# Patient Record
Sex: Female | Born: 1990 | Race: White | Hispanic: No | Marital: Single | Smoking: Never smoker
Health system: Southern US, Community
[De-identification: ages and names within clinical notes are randomized; demographics above are authoritative.]

## PROBLEM LIST (undated history)

## (undated) VITALS — BP 99/66 | HR 86 | Temp 98.1°F | Resp 18 | Ht 64.0 in | Wt 169.0 lb

## (undated) DIAGNOSIS — F329 Major depressive disorder, single episode, unspecified: Secondary | ICD-10-CM

## (undated) DIAGNOSIS — I341 Nonrheumatic mitral (valve) prolapse: Secondary | ICD-10-CM

## (undated) DIAGNOSIS — F609 Personality disorder, unspecified: Secondary | ICD-10-CM

## (undated) DIAGNOSIS — G43909 Migraine, unspecified, not intractable, without status migrainosus: Secondary | ICD-10-CM

## (undated) DIAGNOSIS — F419 Anxiety disorder, unspecified: Secondary | ICD-10-CM

## (undated) DIAGNOSIS — F32A Depression, unspecified: Secondary | ICD-10-CM

## (undated) DIAGNOSIS — F4310 Post-traumatic stress disorder, unspecified: Secondary | ICD-10-CM

## (undated) HISTORY — DX: Personality disorder, unspecified: F60.9

## (undated) HISTORY — DX: Major depressive disorder, single episode, unspecified: F32.9

## (undated) HISTORY — DX: Post-traumatic stress disorder, unspecified: F43.10

## (undated) HISTORY — DX: Depression, unspecified: F32.A

## (undated) HISTORY — PX: WISDOM TOOTH EXTRACTION: SHX21

---
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
## 2010-03-26 ENCOUNTER — Ambulatory Visit: Payer: Self-pay | Admitting: Gynecology

## 2010-09-05 ENCOUNTER — Emergency Department (HOSPITAL_COMMUNITY)
Admission: EM | Admit: 2010-09-05 | Discharge: 2010-09-05 | Disposition: A | Discharge status: Home / Self Care | Payer: PRIVATE HEALTH INSURANCE | Attending: Emergency Medicine | Admitting: Emergency Medicine

## 2010-09-05 DIAGNOSIS — T63481A Toxic effect of venom of other arthropod, accidental (unintentional), initial encounter: Secondary | ICD-10-CM | POA: Insufficient documentation

## 2010-09-05 DIAGNOSIS — T6391XA Toxic effect of contact with unspecified venomous animal, accidental (unintentional), initial encounter: Secondary | ICD-10-CM | POA: Insufficient documentation

## 2010-09-05 DIAGNOSIS — H81399 Other peripheral vertigo, unspecified ear: Secondary | ICD-10-CM | POA: Insufficient documentation

## 2010-09-05 LAB — GLUCOSE, CAPILLARY: Glucose-Capillary: 89 mg/dL (ref 70–99)

## 2010-11-12 ENCOUNTER — Emergency Department (HOSPITAL_COMMUNITY)
Admission: EM | Admit: 2010-11-12 | Discharge: 2010-11-12 | Disposition: A | Discharge status: Home / Self Care | Payer: 59 | Attending: Emergency Medicine | Admitting: Emergency Medicine

## 2010-11-12 DIAGNOSIS — R10819 Abdominal tenderness, unspecified site: Secondary | ICD-10-CM | POA: Insufficient documentation

## 2010-11-12 DIAGNOSIS — M545 Low back pain, unspecified: Secondary | ICD-10-CM | POA: Insufficient documentation

## 2010-11-12 DIAGNOSIS — R300 Dysuria: Secondary | ICD-10-CM | POA: Insufficient documentation

## 2010-11-12 DIAGNOSIS — R82998 Other abnormal findings in urine: Secondary | ICD-10-CM | POA: Insufficient documentation

## 2010-11-12 DIAGNOSIS — R319 Hematuria, unspecified: Secondary | ICD-10-CM | POA: Insufficient documentation

## 2010-11-12 DIAGNOSIS — R109 Unspecified abdominal pain: Secondary | ICD-10-CM | POA: Insufficient documentation

## 2010-11-12 DIAGNOSIS — N390 Urinary tract infection, site not specified: Secondary | ICD-10-CM | POA: Insufficient documentation

## 2010-11-12 LAB — URINE MICROSCOPIC-ADD ON

## 2010-11-12 LAB — URINALYSIS, ROUTINE W REFLEX MICROSCOPIC
Bilirubin Urine: NEGATIVE
Ketones, ur: NEGATIVE mg/dL
Nitrite: NEGATIVE
Protein, ur: 100 mg/dL — AB
Urobilinogen, UA: 1 mg/dL (ref 0.0–1.0)

## 2011-01-24 ENCOUNTER — Ambulatory Visit (INDEPENDENT_AMBULATORY_CARE_PROVIDER_SITE_OTHER): Payer: PRIVATE HEALTH INSURANCE

## 2011-01-24 DIAGNOSIS — G43009 Migraine without aura, not intractable, without status migrainosus: Secondary | ICD-10-CM

## 2011-01-24 DIAGNOSIS — F909 Attention-deficit hyperactivity disorder, unspecified type: Secondary | ICD-10-CM

## 2011-01-24 DIAGNOSIS — M545 Low back pain: Secondary | ICD-10-CM

## 2015-08-21 ENCOUNTER — Encounter (HOSPITAL_COMMUNITY): Payer: Self-pay | Admitting: *Deleted

## 2015-08-21 ENCOUNTER — Emergency Department (HOSPITAL_COMMUNITY): Payer: 59

## 2015-08-21 ENCOUNTER — Emergency Department (HOSPITAL_COMMUNITY)
Admission: EM | Admit: 2015-08-21 | Discharge: 2015-08-21 | Disposition: A | Discharge status: Home / Self Care | Payer: 59 | Attending: Emergency Medicine | Admitting: Emergency Medicine

## 2015-08-21 DIAGNOSIS — R102 Pelvic and perineal pain: Secondary | ICD-10-CM

## 2015-08-21 DIAGNOSIS — Z79899 Other long term (current) drug therapy: Secondary | ICD-10-CM | POA: Insufficient documentation

## 2015-08-21 DIAGNOSIS — Z791 Long term (current) use of non-steroidal anti-inflammatories (NSAID): Secondary | ICD-10-CM | POA: Insufficient documentation

## 2015-08-21 DIAGNOSIS — N730 Acute parametritis and pelvic cellulitis: Secondary | ICD-10-CM

## 2015-08-21 DIAGNOSIS — Z79818 Long term (current) use of other agents affecting estrogen receptors and estrogen levels: Secondary | ICD-10-CM | POA: Insufficient documentation

## 2015-08-21 HISTORY — DX: Nonrheumatic mitral (valve) prolapse: I34.1

## 2015-08-21 HISTORY — DX: Migraine, unspecified, not intractable, without status migrainosus: G43.909

## 2015-08-21 LAB — URINALYSIS, ROUTINE W REFLEX MICROSCOPIC
Bilirubin Urine: NEGATIVE
Glucose, UA: NEGATIVE mg/dL
Hgb urine dipstick: NEGATIVE
Ketones, ur: 15 mg/dL — AB
Leukocytes, UA: NEGATIVE
Nitrite: NEGATIVE
Protein, ur: NEGATIVE mg/dL
Specific Gravity, Urine: 1.01 (ref 1.005–1.030)
pH: 5.5 (ref 5.0–8.0)

## 2015-08-21 LAB — COMPREHENSIVE METABOLIC PANEL
ALT: 18 U/L (ref 14–54)
AST: 19 U/L (ref 15–41)
Albumin: 4.2 g/dL (ref 3.5–5.0)
Alkaline Phosphatase: 55 U/L (ref 38–126)
Anion gap: 7 (ref 5–15)
BUN: 11 mg/dL (ref 6–20)
CO2: 25 mmol/L (ref 22–32)
Calcium: 8.6 mg/dL — ABNORMAL LOW (ref 8.9–10.3)
Chloride: 105 mmol/L (ref 101–111)
Creatinine, Ser: 0.73 mg/dL (ref 0.44–1.00)
GFR calc Af Amer: >60 mL/min (ref >60)
GFR calc non Af Amer: >60 mL/min (ref >60)
Glucose, Bld: 97 mg/dL (ref 65–99)
Potassium: 3.2 mmol/L — ABNORMAL LOW (ref 3.5–5.1)
Sodium: 137 mmol/L (ref 135–145)
Total Bilirubin: 0.4 mg/dL (ref 0.3–1.2)
Total Protein: 7.1 g/dL (ref 6.5–8.1)

## 2015-08-21 LAB — CBC WITH DIFFERENTIAL/PLATELET
Basophils Absolute: 0 10*3/uL (ref 0.0–0.1)
Basophils Relative: 0 %
Eosinophils Absolute: 0.3 10*3/uL (ref 0.0–0.7)
Eosinophils Relative: 3 %
HCT: 39.8 % (ref 36.0–46.0)
Hemoglobin: 14.7 g/dL (ref 12.0–15.0)
Lymphocytes Relative: 31 %
Lymphs Abs: 2.9 10*3/uL (ref 0.7–4.0)
MCH: 30.6 pg (ref 26.0–34.0)
MCHC: 36.9 g/dL — ABNORMAL HIGH (ref 30.0–36.0)
MCV: 82.9 fL (ref 78.0–100.0)
Monocytes Absolute: 0.8 10*3/uL (ref 0.1–1.0)
Monocytes Relative: 8 %
Neutro Abs: 5.3 10*3/uL (ref 1.7–7.7)
Neutrophils Relative %: 58 %
Platelets: 295 10*3/uL (ref 150–400)
RBC: 4.8 MIL/uL (ref 3.87–5.11)
RDW: 12.4 % (ref 11.5–15.5)
WBC: 9.2 10*3/uL (ref 4.0–10.5)

## 2015-08-21 LAB — WET PREP, GENITAL
Sperm: NONE SEEN
Trich, Wet Prep: NONE SEEN
Yeast Wet Prep HPF POC: NONE SEEN

## 2015-08-21 LAB — PREGNANCY, URINE: Preg Test, Ur: NEGATIVE

## 2015-08-21 MED ORDER — DOXYCYCLINE HYCLATE 100 MG PO TABS
100.0000 mg | ORAL_TABLET | Freq: Once | ORAL | Status: AC
  Administered 2015-08-21: 100 mg via ORAL
  Filled 2015-08-21: qty 1

## 2015-08-21 MED ORDER — METRONIDAZOLE 500 MG PO TABS
500.0000 mg | ORAL_TABLET | Freq: Once | ORAL | Status: AC
  Administered 2015-08-21: 500 mg via ORAL
  Filled 2015-08-21: qty 1

## 2015-08-21 MED ORDER — METRONIDAZOLE 500 MG PO TABS: 500.0000 mg | ORAL_TABLET | Freq: Two times a day (BID) | ORAL | Status: DC

## 2015-08-21 MED ORDER — DOXYCYCLINE HYCLATE 100 MG PO CAPS: 100.0000 mg | ORAL_CAPSULE | Freq: Two times a day (BID) | ORAL | Status: DC

## 2015-08-21 MED ORDER — TRAMADOL HCL 50 MG PO TABS: 50.0000 mg | ORAL_TABLET | Freq: Four times a day (QID) | ORAL | Status: DC | PRN

## 2015-08-21 MED ORDER — MORPHINE SULFATE (PF) 4 MG/ML IV SOLN
4.0000 mg | Freq: Once | INTRAVENOUS | Status: AC
  Administered 2015-08-21: 4 mg via INTRAVENOUS
  Filled 2015-08-21: qty 1

## 2015-08-21 MED ORDER — LIDOCAINE HCL 1 % IJ SOLN
INTRAMUSCULAR | Status: AC
  Administered 2015-08-21: 0.9 mL
  Filled 2015-08-21: qty 20

## 2015-08-21 MED ORDER — CEFTRIAXONE SODIUM 250 MG IJ SOLR
250.0000 mg | Freq: Once | INTRAMUSCULAR | Status: AC
  Administered 2015-08-21: 250 mg via INTRAMUSCULAR
  Filled 2015-08-21: qty 250

## 2015-08-21 NOTE — ED Notes (Signed)
Patient transported to Ultrasound 

## 2015-08-21 NOTE — ED Notes (Signed)
Pt states that she began sudden intense sharp abdominal / vaginal pain; pt states that she is now having "the worst cramps of my life"; pt states "I think my IUD has slipped, I cannot find the strings"; pt denies vaginal bleeding or discharge; pt unable to sit due to pain

## 2015-08-21 NOTE — Discharge Instructions (Signed)
Please take medication as directed, please follow-up with primary care for reevaluation in 3 days. If new or worsening signs or symptoms present please return to the emergency room for reevaluation.

## 2015-08-21 NOTE — ED Provider Notes (Signed)
CSN: [id number]     Arrival date & time 08/21/15  8295 History   First MD Initiated Contact with Patient 08/21/15 [phone]     Chief Complaint  Patient presents with  . Vaginal Pain    HPI   25 year old female presents today with vaginal pain. Patient reports that approximately one hour prior to arrival she was masturbating when she felt sharp shooting pain in her vagina. Patient reports symptoms have persisted, she denies any radiation of symptoms, denies any nausea or vomiting, upper abdominal pain, fever, chills, vaginal discharge or vaginal bleeding. Patient reports that she has an IUD, is no longer able to see the strings of the IUD.    Past Medical History  Diagnosis Date  . Mitral valve prolapse   . Migraine    Past Surgical History  Procedure Laterality Date  . Wisdom tooth extraction     No family history on file. Social History  Substance Use Topics  . Smoking status: Never Smoker   . Smokeless tobacco: None  . Alcohol Use: Yes     Comment: socially   OB History    No data available     Review of Systems  All other systems reviewed and are negative.  Allergies  Iodine  Home Medications   Prior to Admission medications   Medication Sig Start Date End Date Taking? Authorizing Provider  acetaminophen (TYLENOL) 500 MG tablet Take 500-1,000 mg by mouth every 6 (six) hours as needed for mild pain or moderate pain.   Yes Historical Provider, MD  fexofenadine (ALLEGRA ALLERGY) 180 MG tablet Take 180 mg by mouth daily.   Yes Historical Provider, MD  Levonorgestrel (MIRENA, 52 MG, IU) 1 Device by Intrauterine route once.   Yes Historical Provider, MD  naproxen sodium (ANAPROX) 220 MG tablet Take 440 mg by mouth 2 (two) times daily as needed (pain).   Yes Historical Provider, MD  doxycycline (VIBRAMYCIN) 100 MG capsule Take 1 capsule (100 mg total) by mouth 2 (two) times daily. 08/21/15   Eyvonne Mechanic, PA-C  metroNIDAZOLE (FLAGYL) 500 MG tablet Take 1 tablet (500 mg  total) by mouth 2 (two) times daily. 08/21/15   Eyvonne Mechanic, PA-C  traMADol (ULTRAM) 50 MG tablet Take 1 tablet (50 mg total) by mouth every 6 (six) hours as needed. 08/21/15   Eyvonne Mechanic, PA-C   BP 116/72 mmHg  Pulse 74  Temp(Src) 98.4 F (36.9 C) (Oral)  Resp 14  Ht  (1.626 m)  Wt 77.111 kg  BMI 29.17 kg/m2  SpO2 97%   Physical Exam  Constitutional: She is oriented to person, place, and time. She appears well-developed and well-nourished.  HENT:  Head: Normocephalic and atraumatic.  Eyes: Conjunctivae are normal. Pupils are equal, round, and reactive to light. Right eye exhibits no discharge. Left eye exhibits no discharge. No scleral icterus.  Neck: Normal range of motion. No JVD present. No tracheal deviation present.  Pulmonary/Chest: Effort normal. No stridor.  Genitourinary: Uterus normal. Cervix exhibits motion tenderness and discharge. Cervix exhibits no friability. Right adnexum displays no mass, no tenderness and no fullness. Left adnexum displays no mass, no tenderness and no fullness.  Sticky white discharge no purulence noted   Neurological: She is alert and oriented to person, place, and time. Coordination normal.  Psychiatric: She has a normal mood and affect. Her behavior is normal. Judgment and thought content normal.  Nursing note and vitals reviewed.   ED Course  Procedures (including critical care time) Labs Review  Labs Reviewed  WET PREP, GENITAL - Abnormal; Notable for the following:    Clue Cells Wet Prep HPF POC PRESENT (*)    WBC, Wet Prep HPF POC FEW (*)    All other components within normal limits  CBC WITH DIFFERENTIAL/PLATELET - Abnormal; Notable for the following:    MCHC 36.9 (*)    All other components within normal limits  COMPREHENSIVE METABOLIC PANEL - Abnormal; Notable for the following:    Potassium 3.2 (*)    Calcium 8.6 (*)    All other components within normal limits  URINALYSIS, ROUTINE W REFLEX MICROSCOPIC (NOT AT [facility]) -  Abnormal; Notable for the following:    Ketones, ur 15 (*)    All other components within normal limits  PREGNANCY, URINE  GC/CHLAMYDIA PROBE AMP ([place]) NOT AT [facility]    Imaging Review [place] Transvaginal Non-ob  08/21/2015  CLINICAL DATA:  Acute onset of midline pelvic pain question ovarian torsion ; has IUD; no menses for 1 year EXAM: TRANSABDOMINAL AND TRANSVAGINAL ULTRASOUND OF PELVIS DOPPLER ULTRASOUND OF OVARIES TECHNIQUE: Both transabdominal and transvaginal ultrasound examinations of the pelvis were performed. Transabdominal technique was performed for global imaging of the pelvis including uterus, ovaries, adnexal regions, and pelvic cul-de-sac. It was necessary to proceed with endovaginal exam following the transabdominal exam to visualize the endometrium and ovaries. Color and duplex Doppler ultrasound was utilized to evaluate blood flow to the ovaries. COMPARISON:  None. FINDINGS: Uterus Measurements: 7.3 x 3.1 x 4.2 cm. Mildly heterogeneous myometrial echogenicity. Normal morphology without mass. Endometrium Thickness: 10 mm thick, normal. IUD identified at upper to mid uterine segment. No endometrial fluid Right ovary Measurements: 4.7 x 4.2 x 3.5 cm. 3.7 x 2.2 x 3.3 cm diameter complex appearing hypoechoic region within RIGHT ovary question hemorrhagic ovarian cyst. Blood flow present within RIGHT ovary on color Doppler imaging. Left ovary Measurements: 1.8 x 1.4 x 2.7 cm. Normal morphology without mass. Blood flow present within LEFT ovary on color Doppler imaging. Pulsed Doppler evaluation of both ovaries demonstrates normal low-resistance arterial and venous waveforms. Other findings Moderate free pelvic fluid containing scattered low level internal echogenicity. IMPRESSION: IUD within uterus without focal uterine abnormality. Complex hypoechoic RIGHT ovarian lesion 3.7 x 2.2 x 3.3 cm question hemorrhagic cyst. Moderate free pelvic fluid, minimally complicated appearance. No evidence of  ovarian torsion. Electronically Signed   By: Ulyses SouthwardMark  Boles M.D.   On: 08/21/2015 08:53   [place] Pelvis Complete  08/21/2015  CLINICAL DATA:  Acute onset of midline pelvic pain question ovarian torsion ; has IUD; no menses for 1 year EXAM: TRANSABDOMINAL AND TRANSVAGINAL ULTRASOUND OF PELVIS DOPPLER ULTRASOUND OF OVARIES TECHNIQUE: Both transabdominal and transvaginal ultrasound examinations of the pelvis were performed. Transabdominal technique was performed for global imaging of the pelvis including uterus, ovaries, adnexal regions, and pelvic cul-de-sac. It was necessary to proceed with endovaginal exam following the transabdominal exam to visualize the endometrium and ovaries. Color and duplex Doppler ultrasound was utilized to evaluate blood flow to the ovaries. COMPARISON:  None. FINDINGS: Uterus Measurements: 7.3 x 3.1 x 4.2 cm. Mildly heterogeneous myometrial echogenicity. Normal morphology without mass. Endometrium Thickness: 10 mm thick, normal. IUD identified at upper to mid uterine segment. No endometrial fluid Right ovary Measurements: 4.7 x 4.2 x 3.5 cm. 3.7 x 2.2 x 3.3 cm diameter complex appearing hypoechoic region within RIGHT ovary question hemorrhagic ovarian cyst. Blood flow present within RIGHT ovary on color Doppler imaging. Left ovary Measurements: 1.8 x 1.4 x 2.7  cm. Normal morphology without mass. Blood flow present within LEFT ovary on color Doppler imaging. Pulsed Doppler evaluation of both ovaries demonstrates normal low-resistance arterial and venous waveforms. Other findings Moderate free pelvic fluid containing scattered low level internal echogenicity. IMPRESSION: IUD within uterus without focal uterine abnormality. Complex hypoechoic RIGHT ovarian lesion 3.7 x 2.2 x 3.3 cm question hemorrhagic cyst. Moderate free pelvic fluid, minimally complicated appearance. No evidence of ovarian torsion. Electronically Signed   By: Ulyses Southward M.D.   On: 08/21/2015 08:53   [place] Art/ven Flow Abd Pelv  Doppler  08/21/2015  CLINICAL DATA:  Acute onset of midline pelvic pain question ovarian torsion ; has IUD; no menses for 1 year EXAM: TRANSABDOMINAL AND TRANSVAGINAL ULTRASOUND OF PELVIS DOPPLER ULTRASOUND OF OVARIES TECHNIQUE: Both transabdominal and transvaginal ultrasound examinations of the pelvis were performed. Transabdominal technique was performed for global imaging of the pelvis including uterus, ovaries, adnexal regions, and pelvic cul-de-sac. It was necessary to proceed with endovaginal exam following the transabdominal exam to visualize the endometrium and ovaries. Color and duplex Doppler ultrasound was utilized to evaluate blood flow to the ovaries. COMPARISON:  None. FINDINGS: Uterus Measurements: 7.3 x 3.1 x 4.2 cm. Mildly heterogeneous myometrial echogenicity. Normal morphology without mass. Endometrium Thickness: 10 mm thick, normal. IUD identified at upper to mid uterine segment. No endometrial fluid Right ovary Measurements: 4.7 x 4.2 x 3.5 cm. 3.7 x 2.2 x 3.3 cm diameter complex appearing hypoechoic region within RIGHT ovary question hemorrhagic ovarian cyst. Blood flow present within RIGHT ovary on color Doppler imaging. Left ovary Measurements: 1.8 x 1.4 x 2.7 cm. Normal morphology without mass. Blood flow present within LEFT ovary on color Doppler imaging. Pulsed Doppler evaluation of both ovaries demonstrates normal low-resistance arterial and venous waveforms. Other findings Moderate free pelvic fluid containing scattered low level internal echogenicity. IMPRESSION: IUD within uterus without focal uterine abnormality. Complex hypoechoic RIGHT ovarian lesion 3.7 x 2.2 x 3.3 cm question hemorrhagic cyst. Moderate free pelvic fluid, minimally complicated appearance. No evidence of ovarian torsion. Electronically Signed   By: Ulyses Southward M.D.   On: 08/21/2015 08:53   I have personally reviewed and evaluated these images and lab results as part of my medical decision-making.   EKG  Interpretation None      MDM   Final diagnoses:  Pelvic pain in female  PID (acute pelvic inflammatory disease)    Labs: Urinalysis pregnancy urine, wet prep, CBC, CMP  Imaging: Ultrasound pelvis complete  Consults:  Therapeutics: Ceftriaxone, doxycycline, metronidazole, morphine  Discharge Meds: Metronidazole, doxycycline, tramadol  Assessment/Plan: 25 year old female presents today with vaginal pain after masturbation. Patient has significant cervical motion tenderness, no signs of trauma on exam. Due to patient's complaints with clue cells noted on wet prep patient will be treated for pelvic inflammatory disease. Patient is afebrile nontoxic at this time. Patient will need primary care follow-up, return to the emergency room if new or worsening signs or symptoms present. Patient verbalized understanding and agreement to today's plan had no further questions or concerns at the time of discharge        Eyvonne Mechanic, PA-C 08/22/15 1542  [address], PA-C 08/22/15 1543  Dione Booze, MD 08/22/15 2250

## 2015-08-21 NOTE — ED Notes (Signed)
PA at bedside.

## 2015-08-21 NOTE — ED Notes (Signed)
Asked about urine sample. Pt reports US made her urinate for the procedure. Will ask again later.

## 2015-08-22 LAB — GC/CHLAMYDIA PROBE AMP (~~LOC~~) NOT AT ARMC
Chlamydia: NEGATIVE
Neisseria Gonorrhea: NEGATIVE

## 2015-09-20 ENCOUNTER — Encounter (HOSPITAL_COMMUNITY): Payer: Self-pay | Admitting: *Deleted

## 2015-09-20 ENCOUNTER — Observation Stay (HOSPITAL_COMMUNITY)
Admission: EM | Admit: 2015-09-20 | Discharge: 2015-09-22 | Disposition: A | Discharge status: Home / Self Care | Payer: 59 | Attending: Internal Medicine | Admitting: Internal Medicine

## 2015-09-20 ENCOUNTER — Emergency Department (HOSPITAL_COMMUNITY): Payer: 59

## 2015-09-20 DIAGNOSIS — D72829 Elevated white blood cell count, unspecified: Secondary | ICD-10-CM

## 2015-09-20 DIAGNOSIS — N200 Calculus of kidney: Secondary | ICD-10-CM | POA: Diagnosis not present

## 2015-09-20 DIAGNOSIS — R110 Nausea: Secondary | ICD-10-CM | POA: Diagnosis not present

## 2015-09-20 DIAGNOSIS — N10 Acute tubulo-interstitial nephritis: Principal | ICD-10-CM | POA: Insufficient documentation

## 2015-09-20 DIAGNOSIS — F419 Anxiety disorder, unspecified: Secondary | ICD-10-CM | POA: Diagnosis not present

## 2015-09-20 DIAGNOSIS — E876 Hypokalemia: Secondary | ICD-10-CM | POA: Diagnosis not present

## 2015-09-20 DIAGNOSIS — I341 Nonrheumatic mitral (valve) prolapse: Secondary | ICD-10-CM | POA: Diagnosis not present

## 2015-09-20 DIAGNOSIS — N12 Tubulo-interstitial nephritis, not specified as acute or chronic: Secondary | ICD-10-CM | POA: Diagnosis not present

## 2015-09-20 LAB — CBC WITH DIFFERENTIAL/PLATELET
Basophils Absolute: 0.1 10*3/uL (ref 0.0–0.1)
Basophils Relative: 0 %
EOS PCT: 4 %
Eosinophils Absolute: 0.6 10*3/uL (ref 0.0–0.7)
HEMATOCRIT: 42.2 % (ref 36.0–46.0)
Hemoglobin: 14.9 g/dL (ref 12.0–15.0)
LYMPHS ABS: 2.7 10*3/uL (ref 0.7–4.0)
LYMPHS PCT: 19 %
MCH: 30.2 pg (ref 26.0–34.0)
MCHC: 35.3 g/dL (ref 30.0–36.0)
MCV: 85.6 fL (ref 78.0–100.0)
MONO ABS: 1.6 10*3/uL — AB (ref 0.1–1.0)
Monocytes Relative: 11 %
Neutro Abs: 9.3 10*3/uL — ABNORMAL HIGH (ref 1.7–7.7)
Neutrophils Relative %: 66 %
PLATELETS: 317 10*3/uL (ref 150–400)
RBC: 4.93 MIL/uL (ref 3.87–5.11)
RDW: 12.1 % (ref 11.5–15.5)
WBC: 14.2 10*3/uL — ABNORMAL HIGH (ref 4.0–10.5)

## 2015-09-20 LAB — URINALYSIS, ROUTINE W REFLEX MICROSCOPIC
Bilirubin Urine: NEGATIVE
GLUCOSE, UA: NEGATIVE mg/dL
KETONES UR: NEGATIVE mg/dL
NITRITE: POSITIVE — AB
PH: 5.5 (ref 5.0–8.0)
Protein, ur: 100 mg/dL — AB
SPECIFIC GRAVITY, URINE: 1.02 (ref 1.005–1.030)

## 2015-09-20 LAB — BASIC METABOLIC PANEL
Anion gap: 9 (ref 5–15)
BUN: 7 mg/dL (ref 6–20)
CO2: 26 mmol/L (ref 22–32)
Calcium: 9 mg/dL (ref 8.9–10.3)
Chloride: 104 mmol/L (ref 101–111)
Creatinine, Ser: 0.49 mg/dL (ref 0.44–1.00)
GFR calc Af Amer: >60 mL/min (ref >60)
GLUCOSE: 97 mg/dL (ref 65–99)
POTASSIUM: 3.2 mmol/L — AB (ref 3.5–5.1)
Sodium: 139 mmol/L (ref 135–145)

## 2015-09-20 LAB — URINE MICROSCOPIC-ADD ON

## 2015-09-20 LAB — POC URINE PREG, ED: Preg Test, Ur: NEGATIVE

## 2015-09-20 MED ORDER — CEFTRIAXONE SODIUM 1 G IJ SOLR
1.0000 g | Freq: Once | INTRAMUSCULAR | Status: AC
  Administered 2015-09-20: 1 g via INTRAVENOUS
  Filled 2015-09-20: qty 10

## 2015-09-20 MED ORDER — SODIUM CHLORIDE 0.9 % IV BOLUS (SEPSIS)
1000.0000 mL | Freq: Once | INTRAVENOUS | Status: AC
  Administered 2015-09-20: 1000 mL via INTRAVENOUS

## 2015-09-20 MED ORDER — ENOXAPARIN SODIUM 40 MG/0.4ML ~~LOC~~ SOLN
40.0000 mg | SUBCUTANEOUS | Status: DC
  Administered 2015-09-20 – 2015-09-21 (×2): 40 mg via SUBCUTANEOUS
  Filled 2015-09-20 (×2): qty 0.4

## 2015-09-20 MED ORDER — KETOROLAC TROMETHAMINE 60 MG/2ML IM SOLN
30.0000 mg | Freq: Once | INTRAMUSCULAR | Status: AC
  Administered 2015-09-20: 30 mg via INTRAMUSCULAR
  Filled 2015-09-20: qty 2

## 2015-09-20 MED ORDER — TRAMADOL HCL 50 MG PO TABS
100.0000 mg | ORAL_TABLET | Freq: Four times a day (QID) | ORAL | Status: DC | PRN
Start: 2015-09-20 — End: 2015-09-21
  Administered 2015-09-20 – 2015-09-21 (×2): 100 mg via ORAL
  Filled 2015-09-20 (×2): qty 2

## 2015-09-20 MED ORDER — ONDANSETRON HCL 4 MG/2ML IJ SOLN
4.0000 mg | Freq: Four times a day (QID) | INTRAMUSCULAR | Status: DC | PRN
  Administered 2015-09-20: 4 mg via INTRAVENOUS
  Filled 2015-09-20: qty 2

## 2015-09-20 MED ORDER — ONDANSETRON HCL 4 MG/2ML IJ SOLN
4.0000 mg | Freq: Once | INTRAMUSCULAR | Status: AC
  Administered 2015-09-20: 4 mg via INTRAVENOUS
  Filled 2015-09-20: qty 2

## 2015-09-20 MED ORDER — DEXTROSE 5 % IV SOLN
1.0000 g | INTRAVENOUS | Status: DC
  Administered 2015-09-21: 1 g via INTRAVENOUS
  Filled 2015-09-20 (×2): qty 10

## 2015-09-20 MED ORDER — POTASSIUM CHLORIDE CRYS ER 20 MEQ PO TBCR
40.0000 meq | EXTENDED_RELEASE_TABLET | Freq: Once | ORAL | Status: AC
  Administered 2015-09-20: 40 meq via ORAL
  Filled 2015-09-20: qty 2

## 2015-09-20 MED ORDER — MORPHINE SULFATE (PF) 2 MG/ML IV SOLN
2.0000 mg | INTRAVENOUS | Status: DC | PRN
  Administered 2015-09-21 (×2): 2 mg via INTRAVENOUS
  Filled 2015-09-20 (×2): qty 1

## 2015-09-20 MED ORDER — SODIUM CHLORIDE 0.9% FLUSH: 3.0000 mL | INTRAVENOUS | Status: DC | PRN

## 2015-09-20 MED ORDER — ONDANSETRON HCL 4 MG PO TABS
4.0000 mg | ORAL_TABLET | Freq: Four times a day (QID) | ORAL | Status: DC | PRN
  Administered 2015-09-21 – 2015-09-22 (×3): 4 mg via ORAL
  Filled 2015-09-20 (×3): qty 1

## 2015-09-20 MED ORDER — ONDANSETRON 4 MG PO TBDP
4.0000 mg | ORAL_TABLET | Freq: Once | ORAL | Status: AC
  Administered 2015-09-20: 4 mg via ORAL
  Filled 2015-09-20: qty 1

## 2015-09-20 MED ORDER — SODIUM CHLORIDE 0.9% FLUSH
3.0000 mL | Freq: Two times a day (BID) | INTRAVENOUS | Status: DC
  Administered 2015-09-20 – 2015-09-21 (×3): 3 mL via INTRAVENOUS

## 2015-09-20 MED ORDER — HYDROXYZINE HCL 10 MG PO TABS
10.0000 mg | ORAL_TABLET | Freq: Three times a day (TID) | ORAL | Status: DC | PRN
  Administered 2015-09-20: 10 mg via ORAL
  Filled 2015-09-20 (×2): qty 1

## 2015-09-20 MED ORDER — FENTANYL CITRATE (PF) 100 MCG/2ML IJ SOLN
50.0000 ug | Freq: Once | INTRAMUSCULAR | Status: AC
  Administered 2015-09-20: 50 ug via INTRAVENOUS
  Filled 2015-09-20: qty 2

## 2015-09-20 MED ORDER — LORATADINE 10 MG PO TABS
10.0000 mg | ORAL_TABLET | Freq: Every day | ORAL | Status: DC
  Administered 2015-09-20 – 2015-09-22 (×3): 10 mg via ORAL
  Filled 2015-09-20 (×3): qty 1

## 2015-09-20 MED ORDER — SODIUM CHLORIDE 0.9 % IV SOLN: 250.0000 mL | INTRAVENOUS | Status: DC | PRN

## 2015-09-20 NOTE — ED Notes (Signed)
Pt. C/o persistent nausea, however, she is able to drink liquids (ginger ale) without emesis.

## 2015-09-20 NOTE — ED Notes (Signed)
RN starting IV and getting labs 

## 2015-09-20 NOTE — ED Notes (Signed)
She remains comfortable and I have informed pt. And her guest that we have all tests back and will hear from our E.D.P. Shortly.  She thanks me for the info.

## 2015-09-20 NOTE — ED Provider Notes (Signed)
[facility]-EMERGENCY DEPT Provider Note   CSN: [id number] Arrival date & time: 09/20/15  1052  First Provider Contact:  First MD Initiated Contact with Patient 09/20/15 1114        History   Chief Complaint Chief Complaint  Patient presents with  . Flank Pain    HPI Sarah Good is a 25 y.o. female.  The history is provided by the patient.  Flank Pain  This is a new problem. Episode onset: several days. The problem occurs constantly. The problem has been gradually worsening. Associated symptoms include headaches (similar to prior). Pertinent negatives include no chest pain. Exacerbated by: laughing; movement. Treatments tried: NSAID. The treatment provided mild relief.   CC: dysuria  Onset/Duration: 1 week Timing: constant Location: n/a Quality: burning Severity: moderate Modifying Factors:  Improved by: nothing  Worsened by: nothing Associated Signs/Symptoms:  Pertinent (+): foul smelling; hematuria; flank pain; N/V  Pertinent (-): vag bleeding/discharge; diarrhea Context: - STI screen last month    Past Medical History:  Diagnosis Date  . Migraine   . Mitral valve prolapse     There are no active problems to display for this patient.   Past Surgical History:  Procedure Laterality Date  . WISDOM TOOTH EXTRACTION      OB History    No data available       Home Medications    Prior to Admission medications   Medication Sig Start Date End Date Taking? Authorizing Provider  acetaminophen (TYLENOL) 500 MG tablet Take 500-1,000 mg by mouth every 6 (six) hours as needed for mild pain or moderate pain.   Yes Historical Provider, MD  fexofenadine (ALLEGRA ALLERGY) 180 MG tablet Take 180 mg by mouth daily.   Yes Historical Provider, MD  hydrOXYzine (ATARAX/VISTARIL) 10 MG tablet Take 10 mg by mouth 3 (three) times daily as needed for anxiety.   Yes Historical Provider, MD  ibuprofen (ADVIL,MOTRIN) 200 MG tablet Take 200 mg by mouth every 6 (six) hours as  needed for moderate pain.   Yes Historical Provider, MD  Levonorgestrel (MIRENA, 52 MG, IU) 1 Device by Intrauterine route once.   Yes Historical Provider, MD  naproxen sodium (ANAPROX) 220 MG tablet Take 440 mg by mouth 2 (two) times daily as needed (pain).   Yes Historical Provider, MD  doxycycline (VIBRAMYCIN) 100 MG capsule Take 1 capsule (100 mg total) by mouth 2 (two) times daily. Patient not taking: Reported on 09/20/2015 08/21/15   Eyvonne Mechanic, PA-C  metroNIDAZOLE (FLAGYL) 500 MG tablet Take 1 tablet (500 mg total) by mouth 2 (two) times daily. Patient not taking: Reported on 09/20/2015 08/21/15   Eyvonne Mechanic, PA-C  traMADol (ULTRAM) 50 MG tablet Take 1 tablet (50 mg total) by mouth every 6 (six) hours as needed. Patient not taking: Reported on 09/20/2015 08/21/15   Eyvonne Mechanic, PA-C    Family History History reviewed. No pertinent family history.  Social History Social History  Substance Use Topics  . Smoking status: Never Smoker  . Smokeless tobacco: Never Used  . Alcohol use Yes     Comment: socially     Allergies   Iodine   Review of Systems Review of Systems  Constitutional: Positive for fatigue. Negative for diaphoresis and fever.  HENT: Negative for congestion.   Eyes: Negative for visual disturbance.  Cardiovascular: Negative for chest pain.  Gastrointestinal: Positive for nausea and vomiting. Negative for constipation and diarrhea.  Genitourinary: Positive for flank pain and frequency. Negative for difficulty urinating, vaginal bleeding,  vaginal discharge and vaginal pain.  Neurological: Positive for headaches (similar to prior).  All other systems reviewed and are negative.    Physical Exam Updated Vital Signs BP 121/82 (BP Location: Right Arm)   Pulse 91   Temp 98 F (36.7 C) (Oral)   Resp 17   SpO2 100%   Physical Exam  Constitutional: She is oriented to person, place, and time. She appears well-developed and well-nourished. No distress.  HENT:    Head: Normocephalic and atraumatic.  Right Ear: External ear normal.  Left Ear: External ear normal.  Nose: Nose normal.  Eyes: Conjunctivae and EOM are normal. Pupils are equal, round, and reactive to light. Right eye exhibits no discharge. Left eye exhibits no discharge. No scleral icterus.  Neck: Normal range of motion. Neck supple.  Cardiovascular: Normal rate, regular rhythm and normal heart sounds.  Exam reveals no gallop and no friction rub.   No murmur heard. Pulmonary/Chest: Effort normal and breath sounds normal. No stridor. No respiratory distress. She has no wheezes.  Abdominal: Soft. She exhibits no distension. There is tenderness in the suprapubic area. There is CVA tenderness. There is no rigidity, no rebound, no guarding, no tenderness at McBurney's point and negative Murphy's sign.  Musculoskeletal: She exhibits no edema or tenderness.  Neurological: She is alert and oriented to person, place, and time.  Skin: Skin is warm and dry. No rash noted. She is not diaphoretic. No erythema.  Psychiatric: She has a normal mood and affect.     ED Treatments / Results  Labs (all labs ordered are listed, but only abnormal results are displayed) Labs Reviewed  URINALYSIS, ROUTINE W REFLEX MICROSCOPIC (NOT AT [facility]) - Abnormal; Notable for the following:       Result Value   Color, Urine AMBER (*)    APPearance CLOUDY (*)    Hgb urine dipstick LARGE (*)    Protein, ur 100 (*)    Nitrite POSITIVE (*)    Leukocytes, UA LARGE (*)    All other components within normal limits  URINE MICROSCOPIC-ADD ON - Abnormal; Notable for the following:    Squamous Epithelial / LPF 6-30 (*)    Bacteria, UA MANY (*)    Crystals CA OXALATE CRYSTALS (*)    All other components within normal limits  CBC WITH DIFFERENTIAL/PLATELET - Abnormal; Notable for the following:    WBC 14.2 (*)    Neutro Abs 9.3 (*)    Monocytes Absolute 1.6 (*)    All other components within normal limits  BASIC METABOLIC  PANEL - Abnormal; Notable for the following:    Potassium 3.2 (*)    All other components within normal limits  POC URINE PREG, ED    EKG  EKG Interpretation None       Radiology Ct Renal Stone Study  Result Date: 09/20/2015 CLINICAL DATA:  Urinary frequency. Pain with urination. Lower abdominal pain. Bilateral flank pain, right greater than left. Nausea and vomiting last night. EXAM: CT ABDOMEN AND PELVIS WITHOUT CONTRAST TECHNIQUE: Multidetector CT imaging of the abdomen and pelvis was performed following the standard protocol without IV contrast. COMPARISON:  None. FINDINGS: Lower chest:  Normal. Hepatobiliary: Normal except for tiny calcifications along surface of the right lobe of the liver adjacent to the upper pole the right kidney. This is not felt be significant. Pancreas: Normal. Spleen: Normal. Adrenals/Urinary Tract: 2 mm stones in the lower poles of each kidney. There is no hydronephrosis or evidence of ureteral or bladder calculi.  Adrenal glands are normal. Stomach/Bowel: No evidence of obstruction, inflammatory process, or abnormal fluid collections. Vascular/Lymphatic: No pathologically enlarged lymph nodes. No evidence of abdominal aortic aneurysm. Reproductive: IUD in place.  Otherwise normal. Other: No free air or free fluid. Musculoskeletal: Chronic bilateral pars defects at L5. No spondylolisthesis. Otherwise normal. IMPRESSION: Tiny bilateral renal calculi.  Otherwise normal exam. Electronically Signed   By: Francene Boyers M.D.   On: 09/20/2015 14:07    Procedures Procedures (including critical care time)  Medications Ordered in ED Medications  ondansetron (ZOFRAN-ODT) disintegrating tablet 4 mg (4 mg Oral Given 09/20/15 1146)  ketorolac (TORADOL) injection 30 mg (30 mg Intramuscular Given 09/20/15 1336)  ondansetron (ZOFRAN) injection 4 mg (4 mg Intravenous Given 09/20/15 1501)  sodium chloride 0.9 % bolus 1,000 mL (1,000 mLs Intravenous New Bag/Given 09/20/15 1501)    fentaNYL (SUBLIMAZE) injection 50 mcg (50 mcg Intravenous Given 09/20/15 1501)  cefTRIAXone (ROCEPHIN) 1 g in dextrose 5 % 50 mL IVPB (0 g Intravenous Stopped 09/20/15 1552)     Initial Impression / Assessment and Plan / ED Course  I have reviewed the triage vital signs and the nursing notes.  Pertinent labs & imaging results that were available during my care of the patient were reviewed by me and considered in my medical decision making (see chart for details).  Clinical Course    Workup consistent with pyelonephritis with small bilateral renal stones. No evidence of sepsis at this time. Decreased PO tolerance. Patient given IV fluids, antiemetics, pain medicine, and Rocephin.   Spoke with Urology regarding the stones. Given the lack of obstruction, no indication for intervention at this time.  Admitted to hospitalist for continued IV Abx and pain management.  Final Clinical Impressions(s) / ED Diagnoses   Final diagnoses:  Pyelonephritis  Nephrolithiasis  Nausea  Leukocytosis    Disposition: Admit  Condition: stable    Nira Conn, MD 09/20/15 2023

## 2015-09-20 NOTE — ED Triage Notes (Addendum)
Patient c/o urinary frequency, cloudy urine and pain with urination x1 week.  Patient states several days ago she began having lower abdominal pain and hematuria. Patient states last night she developed bilateral flank pain that is worse on right side.  Patient had several episodes of N/V last night, but denies fever and diarrhea.

## 2015-09-20 NOTE — H&P (Addendum)
History and Physical    [name] ZOX:[id number] DOB: 1990-11-03 DOA: 09/20/2015  PCP: No primary care provider on file. Patient coming from: Home  Chief Complaint: Dysuria  HPI: Sarah Good is a 25 y.o. female with medical history significant of migraine, depression and anxiety. Patient presented to the [facility] ED for evaluation of dysuria. She states symptoms started one week ago. She reports associated nausea with vomiting, urgency, frequency and flank pain. She tried treating by drinking plenty of water, which did not help. No associated fevers, diaphoresis or palpitations.  ED Course: Vitals significant for mild tachycardia up to 109 with normal BPs and RR. Patient's UA significant for many bacteria, large hemoglobin, large leukocytosis and positive nitrite with flank pain. CT abdomen obtained significant for "tiny stones." Urology consulted in the ED and decided on no intervention at this time since patient does not have an obstruction.   Review of Systems: As per HPI otherwise 10 point review of systems negative.   Past Medical History:  Diagnosis Date  . Migraine   . Mitral valve prolapse     Past Surgical History:  Procedure Laterality Date  . WISDOM TOOTH EXTRACTION       reports that she has never smoked. She has never used smokeless tobacco. She reports that she drinks alcohol. She reports that she uses drugs, including Marijuana.  Allergies  Allergen Reactions  . Iodine Anaphylaxis    Family History  Problem Relation Age of Onset  . Adopted: Yes    Prior to Admission medications   Medication Sig Start Date End Date Taking? Authorizing Provider  acetaminophen (TYLENOL) 500 MG tablet Take 500-1,000 mg by mouth every 6 (six) hours as needed for mild pain or moderate pain.   Yes Historical Provider, MD  fexofenadine (ALLEGRA ALLERGY) 180 MG tablet Take 180 mg by mouth daily.   Yes Historical Provider, MD  hydrOXYzine (ATARAX/VISTARIL) 10 MG  tablet Take 10 mg by mouth 3 (three) times daily as needed for anxiety.   Yes Historical Provider, MD  ibuprofen (ADVIL,MOTRIN) 200 MG tablet Take 200 mg by mouth every 6 (six) hours as needed for moderate pain.   Yes Historical Provider, MD  Levonorgestrel (MIRENA, 52 MG, IU) 1 Device by Intrauterine route once.   Yes Historical Provider, MD  naproxen sodium (ANAPROX) 220 MG tablet Take 440 mg by mouth 2 (two) times daily as needed (pain).   Yes Historical Provider, MD  doxycycline (VIBRAMYCIN) 100 MG capsule Take 1 capsule (100 mg total) by mouth 2 (two) times daily. Patient not taking: Reported on 09/20/2015 08/21/15   Eyvonne MechanicJeffrey Hedges, PA-C  metroNIDAZOLE (FLAGYL) 500 MG tablet Take 1 tablet (500 mg total) by mouth 2 (two) times daily. Patient not taking: Reported on 09/20/2015 08/21/15   Eyvonne MechanicJeffrey Hedges, PA-C  traMADol (ULTRAM) 50 MG tablet Take 1 tablet (50 mg total) by mouth every 6 (six) hours as needed. Patient not taking: Reported on 09/20/2015 08/21/15   Eyvonne MechanicJeffrey Hedges, PA-C    Physical Exam: Vitals:   09/20/15 1600 09/20/15 1615 09/20/15 1630 09/20/15 1644  BP: 120/72   120/72  Pulse: 91 97 92 84  Resp:    18  Temp:      TempSrc:      SpO2: 100% 100% 99% 100%      Constitutional: NAD, calm, comfortable Vitals:   09/20/15 1600 09/20/15 1615 09/20/15 1630 09/20/15 1644  BP: 120/72   120/72  Pulse: 91 97 92 84  Resp:  18  Temp:      TempSrc:      SpO2: 100% 100% 99% 100%   Eyes: PERRL, lids and conjunctivae normal ENMT: Mucous membranes are moist. Posterior pharynx clear of any exudate or lesions.Normal dentition.  Neck: normal, supple, no masses, no thyromegaly Respiratory: clear to auscultation bilaterally, no wheezing, no crackles. Normal respiratory effort. No accessory muscle use.  Cardiovascular: Regular rate and rhythm, no murmurs / rubs / gallops. No extremity edema. 2+ pedal pulses. No carotid bruits.  Abdomen: no tenderness, no masses palpated. No hepatosplenomegaly.  Bowel sounds positive. Musculoskeletal: no clubbing / cyanosis. No joint deformity upper and lower extremities. Good ROM, no contractures. Normal muscle tone.  Skin: no rashes, lesions, ulcers. No induration. Bilateral CVA tenderness. Neurologic: CN 2-12 grossly intact. Sensation intact. Strength 5/5 in all 4.  Psychiatric: Normal judgment and insight. Alert and oriented x 3. Normal mood.   Labs on Admission: I have personally reviewed following labs and imaging studies  CBC:  Recent Labs Lab 09/20/15 1510  WBC 14.2*  NEUTROABS 9.3*  HGB 14.9  HCT 42.2  MCV 85.6  PLT 317   Basic Metabolic Panel:  Recent Labs Lab 09/20/15 1510  NA 139  K 3.2*  CL 104  CO2 26  GLUCOSE 97  BUN 7  CREATININE 0.49  CALCIUM 9.0   GFR: CrCl cannot be calculated (Unknown ideal weight.). Liver Function Tests: No results for input(s): AST, ALT, ALKPHOS, BILITOT, PROT, ALBUMIN in the last 168 hours. No results for input(s): LIPASE, AMYLASE in the last 168 hours. No results for input(s): AMMONIA in the last 168 hours. Coagulation Profile: No results for input(s): INR, PROTIME in the last 168 hours. Cardiac Enzymes: No results for input(s): CKTOTAL, CKMB, CKMBINDEX, TROPONINI in the last 168 hours. BNP (last 3 results) No results for input(s): PROBNP in the last 8760 hours. HbA1C: No results for input(s): HGBA1C in the last 72 hours. CBG: No results for input(s): GLUCAP in the last 168 hours. Lipid Profile: No results for input(s): CHOL, HDL, LDLCALC, TRIG, CHOLHDL, LDLDIRECT in the last 72 hours. Thyroid Function Tests: No results for input(s): TSH, T4TOTAL, FREET4, T3FREE, THYROIDAB in the last 72 hours. Anemia Panel: No results for input(s): VITAMINB12, FOLATE, FERRITIN, TIBC, IRON, RETICCTPCT in the last 72 hours. Urine analysis:    Component Value Date/Time   COLORURINE AMBER (A) 09/20/2015 1101   APPEARANCEUR CLOUDY (A) 09/20/2015 1101   LABSPEC 1.020 09/20/2015 1101   PHURINE  5.5 09/20/2015 1101   GLUCOSEU NEGATIVE 09/20/2015 1101   HGBUR LARGE (A) 09/20/2015 1101   BILIRUBINUR NEGATIVE 09/20/2015 1101   KETONESUR NEGATIVE 09/20/2015 1101   PROTEINUR 100 (A) 09/20/2015 1101   UROBILINOGEN 1.0 11/12/2010 1910   NITRITE POSITIVE (A) 09/20/2015 1101   LEUKOCYTESUR LARGE (A) 09/20/2015 1101   Sepsis Labs: !!!!!!!!!!!!!!!!!!!!!!!!!!!!!!!!!!!!!!!!!!!! (procalcitonin:4,lacticidven:4) )No results found for this or any previous visit (from the past 240 hour(s)).   Radiological Exams on Admission: Ct Renal Stone Study  Result Date: 09/20/2015 CLINICAL DATA:  Urinary frequency. Pain with urination. Lower abdominal pain. Bilateral flank pain, right greater than left. Nausea and vomiting last night. EXAM: CT ABDOMEN AND PELVIS WITHOUT CONTRAST TECHNIQUE: Multidetector CT imaging of the abdomen and pelvis was performed following the standard protocol without IV contrast. COMPARISON:  None. FINDINGS: Lower chest:  Normal. Hepatobiliary: Normal except for tiny calcifications along surface of the right lobe of the liver adjacent to the upper pole the right kidney. This is not felt be significant. Pancreas:  Normal. Spleen: Normal. Adrenals/Urinary Tract: 2 mm stones in the lower poles of each kidney. There is no hydronephrosis or evidence of ureteral or bladder calculi. Adrenal glands are normal. Stomach/Bowel: No evidence of obstruction, inflammatory process, or abnormal fluid collections. Vascular/Lymphatic: No pathologically enlarged lymph nodes. No evidence of abdominal aortic aneurysm. Reproductive: IUD in place.  Otherwise normal. Other: No free air or free fluid. Musculoskeletal: Chronic bilateral pars defects at L5. No spondylolisthesis. Otherwise normal. IMPRESSION: Tiny bilateral renal calculi.  Otherwise normal exam. Electronically Signed   By: Francene Boyers M.D.   On: 09/20/2015 14:07    Assessment/Plan Principal Problem:   Pyelonephritis Active Problems:    Nausea with vomiting   Leukocytosis   Hypokalemia   Anxiety  Pyelonephritis Early. Patient with flank pain and UTI based on symptoms and UA results. Culture pending. Started on ceftriaxone in the ED -continue ceftriaxone 1g q24 hours - Tramadol 100mg  q6hrs prn - morphine 2mg  q4hrs prn for breakthrough  Nausea with vomiting Improved with zofran -continue zofran  Leukocytosis Likely secondary to urinary infection  Hypokalemia Potassium of 3.2 on admission - kdur x1 - recheck BMP in AM  Anxiety -continue home hydroxyzine    DVT prophylaxis: Lovenox Code Status: Full Code Family Communication: None at bedside Disposition Plan: Likely discharge home tomorrow Consults called: None Admission status: Observation   Jacquelin Hawking MD [facility]  If 7PM-7AM, please contact night-coverage [email or web address] Password [email or web address]  09/20/2015, 4:56 PM

## 2015-09-20 NOTE — Progress Notes (Signed)
[facility] ED CM noted pt with [employer] coverage but no pcp listed Spoke with pt who confirms no pcp but aware of how to obtain one with insurance coverage customer service number or web site  [facility] spoke with pt on how to obtain an in network pcp with insurance coverage via the customer service number or web site  Cm reviewed ED level of care for crisis/emergent services and community pcp level of care to manage continuous or chronic medical concerns.  The pt voiced understanding CM encouraged pt and discussed pt's responsibility to verify with pt's insurance carrier that any recommended medical provider offered by any emergency room or a hospital provider is within the carrier's network. The pt voiced understanding

## 2015-09-21 DIAGNOSIS — F419 Anxiety disorder, unspecified: Secondary | ICD-10-CM | POA: Diagnosis not present

## 2015-09-21 DIAGNOSIS — N200 Calculus of kidney: Secondary | ICD-10-CM | POA: Diagnosis not present

## 2015-09-21 DIAGNOSIS — N12 Tubulo-interstitial nephritis, not specified as acute or chronic: Secondary | ICD-10-CM | POA: Diagnosis not present

## 2015-09-21 DIAGNOSIS — R110 Nausea: Secondary | ICD-10-CM | POA: Diagnosis not present

## 2015-09-21 DIAGNOSIS — E876 Hypokalemia: Secondary | ICD-10-CM | POA: Diagnosis not present

## 2015-09-21 LAB — BASIC METABOLIC PANEL
Anion gap: 6 (ref 5–15)
BUN: 6 mg/dL (ref 6–20)
CALCIUM: 8.4 mg/dL — AB (ref 8.9–10.3)
CHLORIDE: 109 mmol/L (ref 101–111)
CO2: 25 mmol/L (ref 22–32)
CREATININE: 0.71 mg/dL (ref 0.44–1.00)
GFR calc non Af Amer: >60 mL/min (ref >60)
GLUCOSE: 89 mg/dL (ref 65–99)
Potassium: 3.9 mmol/L (ref 3.5–5.1)
Sodium: 140 mmol/L (ref 135–145)

## 2015-09-21 LAB — CBC
HCT: 37.4 % (ref 36.0–46.0)
Hemoglobin: 12.9 g/dL (ref 12.0–15.0)
MCH: 29.7 pg (ref 26.0–34.0)
MCHC: 34.5 g/dL (ref 30.0–36.0)
MCV: 86 fL (ref 78.0–100.0)
PLATELETS: 283 10*3/uL (ref 150–400)
RBC: 4.35 MIL/uL (ref 3.87–5.11)
RDW: 12.2 % (ref 11.5–15.5)
WBC: 10.5 10*3/uL (ref 4.0–10.5)

## 2015-09-21 MED ORDER — ZOLPIDEM TARTRATE 5 MG PO TABS
5.0000 mg | ORAL_TABLET | Freq: Every evening | ORAL | Status: DC | PRN
  Administered 2015-09-21: 5 mg via ORAL
  Filled 2015-09-21: qty 1

## 2015-09-21 MED ORDER — KETOROLAC TROMETHAMINE 30 MG/ML IJ SOLN
30.0000 mg | INTRAMUSCULAR | Status: DC | PRN
  Administered 2015-09-21 – 2015-09-22 (×4): 30 mg via INTRAVENOUS
  Filled 2015-09-21 (×4): qty 1

## 2015-09-21 NOTE — Progress Notes (Signed)
PROGRESS NOTE                                                                                                                                                                                                             Patient Demographics:    Sarah Good, is a 25 y.o. female, DOB - 1990-08-20, ZOX:[id number]  Admit date - 09/20/2015   Admitting Physician Narda Bonds, MD  Outpatient Primary MD for the patient is No primary care provider on file.  LOS - 0  Outpatient Specialists: None  Chief Complaint  Patient presents with  . Flank Pain       Brief Narrative   25 year old female with history of migraine, anxiety and depression presented with dysuria and bilateral flank pain. Had associated nausea with vomiting, urinary urgency and frequency. Patient was found to have UTI with CT abdomen showing bilateral tiny stones. Urology consulted from the ED and recommended no intervention given lack of obstruction. Admitted for treatment of acute pyelonephritis.   Subjective:   Still has bilateral flank pain but better since admission. Has mild dysuria but no nausea or vomiting.   Assessment  & Plan :    Principal Problem:  Acute Pyelonephritis Empiric IV Rocephin. Urine culture not sent on admission and ordered this morning. Pain control with when necessary Toradol alternating with IV morphine. Supportive care with IV fluids and antiemetics. Encouraged to keep well hydrated and ambulate.  Active Problems:   Hypokalemia Replenished    Anxiety Continue hydroxyzine.       Code Status : Full code  Family Communication  : None at bedside  Disposition Plan  : Home tomorrow if symptoms improved  Barriers For Discharge : Improving symptoms  Consults  :  None  Procedures  : CT renal study  DVT Prophylaxis  :  Lovenox -  Lab Results  Component Value Date   PLT 283 09/21/2015    Antibiotics  :     Anti-infectives    Start     Dose/Rate Route Frequency Ordered Stop   09/21/15 1500  cefTRIAXone (ROCEPHIN) 1 g in dextrose 5 % 50 mL IVPB     1 g 100 mL/hr over 30 Minutes Intravenous Every 24 hours 09/20/15 1807     09/20/15 1445  cefTRIAXone (ROCEPHIN) 1 g in dextrose 5 % 50 mL  IVPB     1 g 100 mL/hr over 30 Minutes Intravenous  Once 09/20/15 1443 09/20/15 1552        Objective:   Vitals:   09/20/15 1750 09/20/15 2100 09/21/15 0515 09/21/15 0818  BP: 119/80 113/72 119/77   Pulse: 77 76 79   Resp: Temp: 99.1 F (37.3 C) 98.6 F (37 C) 99 F (37.2 C)   TempSrc: Oral Oral Oral   SpO2: 99% 100% 100%   Weight:    73.8 kg (162 lb 9.6 oz)    Wt Readings from Last 3 Encounters:  09/21/15 73.8 kg (162 lb 9.6 oz)  08/21/15 77.1 kg (170 lb)     Intake/Output Summary (Last 24 hours) at 09/21/15 1148 Last data filed at 09/21/15 0500  Gross per 24 hour  Intake                0 ml  Output                0 ml  Net                0 ml     Physical Exam  Gen: not in distress,  anxious HEENT:   moist mucosa, supple neck Chest: clear b/l, no added sounds CVS: N S1&S2, no murmurs, rubs or gallop GI: soft, , ND, BS+, mild bilateral CVA tenderness, mild suprapubic tenderness Musculoskeletal: warm, no edema     Data Review:    CBC  Recent Labs Lab 09/20/15 1510 09/21/15 0437  WBC 14.2* 10.5  HGB 14.9 12.9  HCT 42.2 37.4  PLT 317 283  MCV 85.6 86.0  MCH 30.2 29.7  MCHC 35.3 34.5  RDW 12.1 12.2  LYMPHSABS 2.7  --   MONOABS 1.6*  --   EOSABS 0.6  --   BASOSABS 0.1  --     Chemistries   Recent Labs Lab 09/20/15 1510 09/21/15 0437  NA 139 140  K 3.2* 3.9  CL 104 109  CO2 26 25  GLUCOSE 97 89  BUN 7 6  CREATININE 0.49 0.71  CALCIUM 9.0 8.4*   ------------------------------------------------------------------------------------------------------------------ No results for input(s): CHOL, HDL, LDLCALC, TRIG, CHOLHDL, LDLDIRECT in the last  72 hours.  No results found for: HGBA1C ------------------------------------------------------------------------------------------------------------------ No results for input(s): TSH, T4TOTAL, T3FREE, THYROIDAB in the last 72 hours.  Invalid input(s): FREET3 ------------------------------------------------------------------------------------------------------------------ No results for input(s): VITAMINB12, FOLATE, FERRITIN, TIBC, IRON, RETICCTPCT in the last 72 hours.  Coagulation profile No results for input(s): INR, PROTIME in the last 168 hours.  No results for input(s): DDIMER in the last 72 hours.  Cardiac Enzymes No results for input(s): CKMB, TROPONINI, MYOGLOBIN in the last 168 hours.  Invalid input(s): CK ------------------------------------------------------------------------------------------------------------------ No results found for: BNP  Inpatient Medications  Scheduled Meds: . cefTRIAXone (ROCEPHIN)  IV  1 g Intravenous Q24H  . enoxaparin (LOVENOX) injection  40 mg Subcutaneous Q24H  . loratadine  10 mg Oral Daily  . sodium chloride flush  3 mL Intravenous Q12H   Continuous Infusions:  PRN Meds:.sodium chloride, hydrOXYzine, ketorolac, morphine injection, ondansetron **OR** ondansetron (ZOFRAN) IV, sodium chloride flush, zolpidem  Micro Results No results found for this or any previous visit (from the past 240 hour(s)).  Radiology Reports Ct Renal Stone Study  Result Date: 09/20/2015 CLINICAL DATA:  Urinary frequency. Pain with urination. Lower abdominal pain. Bilateral flank pain, right greater than left. Nausea and vomiting last night. EXAM: CT ABDOMEN AND PELVIS WITHOUT CONTRAST  TECHNIQUE: Multidetector CT imaging of the abdomen and pelvis was performed following the standard protocol without IV contrast. COMPARISON:  None. FINDINGS: Lower chest:  Normal. Hepatobiliary: Normal except for tiny calcifications along surface of the right lobe of the liver  adjacent to the upper pole the right kidney. This is not felt be significant. Pancreas: Normal. Spleen: Normal. Adrenals/Urinary Tract: 2 mm stones in the lower poles of each kidney. There is no hydronephrosis or evidence of ureteral or bladder calculi. Adrenal glands are normal. Stomach/Bowel: No evidence of obstruction, inflammatory process, or abnormal fluid collections. Vascular/Lymphatic: No pathologically enlarged lymph nodes. No evidence of abdominal aortic aneurysm. Reproductive: IUD in place.  Otherwise normal. Other: No free air or free fluid. Musculoskeletal: Chronic bilateral pars defects at L5. No spondylolisthesis. Otherwise normal. IMPRESSION: Tiny bilateral renal calculi.  Otherwise normal exam. Electronically Signed   By: Francene BoyersJames  Maxwell M.D.   On: 09/20/2015 14:07    Time Spent in minutes  25   Eddie NorthHUNGEL, Rance Smithson M.D on 09/21/2015 at 11:48 AM  Between 7am to 7pm - Pager - [phone]  After 7pm go to [email or web address] - password [email or web address]  [facility] -  Office  [phone]

## 2015-09-21 NOTE — Progress Notes (Signed)
Information given to pt about [email or web address], [facility], pt can also check with MD for an affordable medication. Pt states that her mother will help her this time with medications.

## 2015-09-22 DIAGNOSIS — N12 Tubulo-interstitial nephritis, not specified as acute or chronic: Secondary | ICD-10-CM | POA: Diagnosis not present

## 2015-09-22 DIAGNOSIS — F419 Anxiety disorder, unspecified: Secondary | ICD-10-CM | POA: Diagnosis not present

## 2015-09-22 DIAGNOSIS — R110 Nausea: Secondary | ICD-10-CM | POA: Diagnosis not present

## 2015-09-22 DIAGNOSIS — E876 Hypokalemia: Secondary | ICD-10-CM | POA: Diagnosis not present

## 2015-09-22 LAB — URINE CULTURE: Culture: <10000 — AB

## 2015-09-22 MED ORDER — CIPROFLOXACIN HCL 500 MG PO TABS: 500.0000 mg | ORAL_TABLET | Freq: Two times a day (BID) | ORAL | 0 refills | Status: AC

## 2015-09-22 MED ORDER — ONDANSETRON HCL 4 MG PO TABS: 4.0000 mg | ORAL_TABLET | Freq: Three times a day (TID) | ORAL | 0 refills | Status: DC | PRN

## 2015-09-22 NOTE — Discharge Instructions (Signed)

## 2015-09-22 NOTE — Discharge Summary (Signed)
Physician Discharge Summary  [name] [name] ZOX:[id number] DOB: 1990-04-15 DOA: 09/20/2015  PCP: No primary care provider on file.  Admit date: 09/20/2015 Discharge date: 09/22/2015  Admitted From: Home Disposition:  Home  Recommendations for Outpatient Follow-up:  Discharge home. We'll complete 10 day course of antibiotics on 8/20-2017.  Home Health: None Equipment/Devices: None  Discharge Condition: Stable CODE STATUS: Full code Diet recommendation: Regular    Discharge Diagnoses:  Principal Problem:   Acute Pyelonephritis  Active Problems:   Nausea   Leukocytosis   Hypokalemia   Anxiety   Nephrolithiasis  Brief narrative/history of present illness Please refer to admission H&P for details, in brief, 25 year old female with history of migraine, anxiety and depression presented with dysuria and bilateral flank pain. Had associated nausea with vomiting, urinary urgency and frequency. Patient was found to have UTI with CT abdomen showing bilateral tiny stones. Urology consulted from the ED and recommended no intervention given lack of obstruction. Admitted for treatment of acute pyelonephritis.  Hospital course   Principal Problem:  Acute Pyelonephritis Placed on Empiric IV Rocephin. Urine culture not sent on admission and sent out next day showing insignificant growth. Pain control with when necessary Toradol . Given supportive care with IV fluids and antiemetics. Symptoms much improved today, remains afebrile. I will discharge her on oral ciprofloxacin to complete a ten-day course of antibiotics. Patient encouraged on increased fluid intake to keep herself well-hydrated.    Active Problems:   Hypokalemia Replenished    Anxiety Continue hydroxyzine.      Family Communication  : None at bedside  Disposition Plan  : Home  Consults  :  None  Procedures  : CT renal study   Discharge Instructions     Medication List    STOP taking  these medications   naproxen sodium 220 MG tablet Commonly known as:  ANAPROX     TAKE these medications   acetaminophen 500 MG tablet Commonly known as:  TYLENOL Take 500-1,000 mg by mouth every 6 (six) hours as needed for mild pain or moderate pain.   ALLEGRA ALLERGY 180 MG tablet Generic drug:  fexofenadine Take 180 mg by mouth daily.   ciprofloxacin 500 MG tablet Commonly known as:  CIPRO Take 1 tablet (500 mg total) by mouth 2 (two) times daily.   hydrOXYzine 10 MG tablet Commonly known as:  ATARAX/VISTARIL Take 10 mg by mouth 3 (three) times daily as needed for anxiety.   ibuprofen 200 MG tablet Commonly known as:  ADVIL,MOTRIN Take 200 mg by mouth every 6 (six) hours as needed for moderate pain.   MIRENA (52 MG) IU 1 Device by Intrauterine route once.       Allergies  Allergen Reactions  . Iodine Anaphylaxis        Procedures/Studies: Ct Renal Stone Study  Result Date: 09/20/2015 CLINICAL DATA:  Urinary frequency. Pain with urination. Lower abdominal pain. Bilateral flank pain, right greater than left. Nausea and vomiting last night. EXAM: CT ABDOMEN AND PELVIS WITHOUT CONTRAST TECHNIQUE: Multidetector CT imaging of the abdomen and pelvis was performed following the standard protocol without IV contrast. COMPARISON:  None. FINDINGS: Lower chest:  Normal. Hepatobiliary: Normal except for tiny calcifications along surface of the right lobe of the liver adjacent to the upper pole the right kidney. This is not felt be significant. Pancreas: Normal. Spleen: Normal. Adrenals/Urinary Tract: 2 mm stones in the lower poles of each kidney. There is no hydronephrosis or evidence of ureteral or bladder calculi. Adrenal  glands are normal. Stomach/Bowel: No evidence of obstruction, inflammatory process, or abnormal fluid collections. Vascular/Lymphatic: No pathologically enlarged lymph nodes. No evidence of abdominal aortic aneurysm. Reproductive: IUD in place.  Otherwise normal.  Other: No free air or free fluid. Musculoskeletal: Chronic bilateral pars defects at L5. No spondylolisthesis. Otherwise normal. IMPRESSION: Tiny bilateral renal calculi.  Otherwise normal exam. Electronically Signed   By: Francene Boyers M.D.   On: 09/20/2015 14:07     Subjective: Minimal flank discomfort. Denies nausea or vomiting. Afebrile.  Discharge Exam: Vitals:   09/21/15 2222 09/22/15 0413  BP: 118/76 125/74  Pulse: 65 81  Resp: 20 20  Temp: 98.2 F (36.8 C) 98.2 F (36.8 C)   Vitals:   09/21/15 1300 09/21/15 1500 09/21/15 2222 09/22/15 0413  BP:  114/74 118/76 125/74  Pulse:  65 65 81  Resp:  18 20 20   Temp:  98.1 F (36.7 C) 98.2 F (36.8 C) 98.2 F (36.8 C)  TempSrc:  Oral Oral   SpO2:  100% 100% 100%  Weight:      Height: 5\' 4"  (1.626 m)       General: Young female not in distress HEENT: Moist mucosal Chest: Clear bilaterally Cardiovascular: RRR, S1/S2 +, no rubs, no gallops Abdominal: Soft, NT, ND, bowel sounds + , NO CVA tenderness Extremities: Warm, no edema    The results of significant diagnostics from this hospitalization (including imaging, microbiology, ancillary and laboratory) are listed below for reference.     Microbiology: No results found for this or any previous visit (from the past 240 hour(s)).   Labs: BNP (last 3 results) No results for input(s): BNP in the last 8760 hours. Basic Metabolic Panel:  Recent Labs Lab 09/20/15 1510 09/21/15 0437  NA 139 140  K 3.2* 3.9  CL 104 109  CO2 26 25  GLUCOSE 97 89  BUN 7 6  CREATININE 0.49 0.71  CALCIUM 9.0 8.4*   Liver Function Tests: No results for input(s): AST, ALT, ALKPHOS, BILITOT, PROT, ALBUMIN in the last 168 hours. No results for input(s): LIPASE, AMYLASE in the last 168 hours. No results for input(s): AMMONIA in the last 168 hours. CBC:  Recent Labs Lab 09/20/15 1510 09/21/15 0437  WBC 14.2* 10.5  NEUTROABS 9.3*  --   HGB 14.9 12.9  HCT 42.2 37.4  MCV 85.6 86.0   PLT 317 283   Cardiac Enzymes: No results for input(s): CKTOTAL, CKMB, CKMBINDEX, TROPONINI in the last 168 hours. BNP: Invalid input(s): POCBNP CBG: No results for input(s): GLUCAP in the last 168 hours. D-Dimer No results for input(s): DDIMER in the last 72 hours. Hgb A1c No results for input(s): HGBA1C in the last 72 hours. Lipid Profile No results for input(s): CHOL, HDL, LDLCALC, TRIG, CHOLHDL, LDLDIRECT in the last 72 hours. Thyroid function studies No results for input(s): TSH, T4TOTAL, T3FREE, THYROIDAB in the last 72 hours.  Invalid input(s): FREET3 Anemia work up No results for input(s): VITAMINB12, FOLATE, FERRITIN, TIBC, IRON, RETICCTPCT in the last 72 hours. Urinalysis    Component Value Date/Time   COLORURINE AMBER (A) 09/20/2015 1101   APPEARANCEUR CLOUDY (A) 09/20/2015 1101   LABSPEC 1.020 09/20/2015 1101   PHURINE 5.5 09/20/2015 1101   GLUCOSEU NEGATIVE 09/20/2015 1101   HGBUR LARGE (A) 09/20/2015 1101   BILIRUBINUR NEGATIVE 09/20/2015 1101   KETONESUR NEGATIVE 09/20/2015 1101   PROTEINUR 100 (A) 09/20/2015 1101   UROBILINOGEN 1.0 11/12/2010 1910   NITRITE POSITIVE (A) 09/20/2015 1101   LEUKOCYTESUR  LARGE (A) 09/20/2015 1101   Sepsis Labs Invalid input(s): PROCALCITONIN,  WBC,  LACTICIDVEN Microbiology No results found for this or any previous visit (from the past 240 hour(s)).   Time coordinating discharge: < 30 minutes  SIGNED:   Eddie North, MD  [facility] 09/22/2015, 9:06 AM Pager   If 7PM-7AM, please contact night-coverage [email or web address] Password [email or web address]

## 2015-09-22 NOTE — Progress Notes (Signed)
Discharge instructions reviewed with patient. Patient verbalized understanding. 

## 2015-11-09 ENCOUNTER — Encounter (HOSPITAL_COMMUNITY): Payer: Self-pay | Admitting: *Deleted

## 2015-11-09 ENCOUNTER — Inpatient Hospital Stay (HOSPITAL_COMMUNITY)
Admission: RE | Admit: 2015-11-09 | Discharge: 2015-11-16 | DRG: 885 | Disposition: A | Discharge status: Home / Self Care | Payer: 59 | Attending: Psychiatry | Admitting: Psychiatry

## 2015-11-09 DIAGNOSIS — R45851 Suicidal ideations: Secondary | ICD-10-CM | POA: Diagnosis present

## 2015-11-09 DIAGNOSIS — F332 Major depressive disorder, recurrent severe without psychotic features: Principal | ICD-10-CM | POA: Diagnosis present

## 2015-11-09 DIAGNOSIS — F419 Anxiety disorder, unspecified: Secondary | ICD-10-CM | POA: Diagnosis present

## 2015-11-09 DIAGNOSIS — I341 Nonrheumatic mitral (valve) prolapse: Secondary | ICD-10-CM | POA: Diagnosis present

## 2015-11-09 DIAGNOSIS — G4700 Insomnia, unspecified: Secondary | ICD-10-CM | POA: Diagnosis present

## 2015-11-09 DIAGNOSIS — G43909 Migraine, unspecified, not intractable, without status migrainosus: Secondary | ICD-10-CM | POA: Diagnosis present

## 2015-11-09 DIAGNOSIS — Z791 Long term (current) use of non-steroidal anti-inflammatories (NSAID): Secondary | ICD-10-CM | POA: Diagnosis not present

## 2015-11-09 DIAGNOSIS — Z23 Encounter for immunization: Secondary | ICD-10-CM | POA: Diagnosis not present

## 2015-11-09 DIAGNOSIS — E559 Vitamin D deficiency, unspecified: Secondary | ICD-10-CM | POA: Diagnosis present

## 2015-11-09 DIAGNOSIS — Z79899 Other long term (current) drug therapy: Secondary | ICD-10-CM | POA: Diagnosis not present

## 2015-11-09 MED ORDER — ALUM & MAG HYDROXIDE-SIMETH 200-200-20 MG/5ML PO SUSP: 30.0000 mL | ORAL | Status: DC | PRN

## 2015-11-09 MED ORDER — ACETAMINOPHEN 325 MG PO TABS
650.0000 mg | ORAL_TABLET | Freq: Four times a day (QID) | ORAL | Status: DC | PRN
  Administered 2015-11-09 – 2015-11-15 (×3): 650 mg via ORAL
  Filled 2015-11-09 (×3): qty 2

## 2015-11-09 MED ORDER — HYDROXYZINE HCL 25 MG PO TABS
25.0000 mg | ORAL_TABLET | Freq: Four times a day (QID) | ORAL | Status: DC | PRN
  Administered 2015-11-09 – 2015-11-14 (×6): 25 mg via ORAL
  Filled 2015-11-09 (×6): qty 1

## 2015-11-09 MED ORDER — TRAZODONE HCL 100 MG PO TABS
100.0000 mg | ORAL_TABLET | Freq: Every day | ORAL | Status: DC
  Administered 2015-11-09: 100 mg via ORAL
  Filled 2015-11-09 (×3): qty 1

## 2015-11-09 MED ORDER — BUTALBITAL-APAP-CAFFEINE 50-325-40 MG PO TABS
2.0000 | ORAL_TABLET | Freq: Four times a day (QID) | ORAL | Status: DC | PRN
  Administered 2015-11-12: 2 via ORAL
  Administered 2015-11-14: 1 via ORAL
  Filled 2015-11-09: qty 2
  Filled 2015-11-09: qty 1

## 2015-11-09 MED ORDER — MAGNESIUM HYDROXIDE 400 MG/5ML PO SUSP: 30.0000 mL | Freq: Every day | ORAL | Status: DC | PRN

## 2015-11-09 MED ORDER — INFLUENZA VAC SPLIT QUAD 0.5 ML IM SUSY
0.5000 mL | PREFILLED_SYRINGE | INTRAMUSCULAR | Status: AC
  Administered 2015-11-12: 0.5 mL via INTRAMUSCULAR
  Filled 2015-11-09: qty 0.5

## 2015-11-09 MED ORDER — LORATADINE 10 MG PO TABS
10.0000 mg | ORAL_TABLET | Freq: Once | ORAL | Status: AC
  Administered 2015-11-09: 10 mg via ORAL
  Filled 2015-11-09 (×2): qty 1

## 2015-11-09 MED ORDER — TRAZODONE HCL 50 MG PO TABS
50.0000 mg | ORAL_TABLET | Freq: Every evening | ORAL | Status: DC | PRN
Start: 2015-11-09 — End: 2015-11-10
  Filled 2015-11-09: qty 1

## 2015-11-09 NOTE — Progress Notes (Signed)
[facility] and expressing SI to OD on "whatever I can get my hands on." Reports stressors include conflictive relationships with friends and family, hx of depression, eating disorder, ADHD and borderline PD. Last hospitalization was in January however patient states she has not been taking medications. She reports sadness, worthlessness, irritability and anhedonia. Admits to neglecting ADLs. PMH includes MVP and migraines. Patient was searched, skin assessment completed and pertinent paperwork completed. Oriented to unit. Medicated per orders. She denies SI and contracts for safety now that she is in the hospital. No HI, AVH and she remains safe on level III obs.

## 2015-11-09 NOTE — H&P (Signed)
Behavioral Health Medical Screening Exam  [name] Dillard CannonLibin is an 25 y.o. female.  Total Time spent with patient: 45 minutes  Psychiatric Specialty Exam: Physical Exam  Constitutional: She is oriented to person, place, and time. She appears well-developed and well-nourished.  HENT:  Head: Normocephalic.  Eyes: Conjunctivae and EOM are normal. Pupils are equal, round, and reactive to light.  Neck: Normal range of motion.  Cardiovascular: Normal rate, regular rhythm and normal heart sounds.   Respiratory: Effort normal and breath sounds normal.  GI: Soft.  Genitourinary:  Genitourinary Comments: Exam deferred, denies issues  Musculoskeletal: Normal range of motion.  Neurological: She is alert and oriented to person, place, and time.  Skin: Skin is warm and dry.  Psychiatric: Her behavior is normal. Judgment normal. She exhibits a depressed mood. She expresses suicidal ideation. She expresses suicidal plans.    Review of Systems  Constitutional: Negative.   HENT: Negative.   Eyes: Negative.   Respiratory: Negative.   Cardiovascular: Negative.   Gastrointestinal: Negative.   Genitourinary: Negative.   Musculoskeletal: Negative.   Skin: Negative.   Neurological: Negative.   Endo/Heme/Allergies: Negative.   Psychiatric/Behavioral: Positive for depression and suicidal ideas.    There were no vitals taken for this visit.There is no height or weight on file to calculate BMI.  General Appearance: Disheveled  Eye Contact:  Fair  Speech:  Normal Rate  Volume:  Normal  Mood:  Depressed  Affect:  Congruent  Thought Process:  Coherent and Descriptions of Associations: Intact  Orientation:  Full (Time, Place, and Person)  Thought Content:  Rumination  Suicidal Thoughts:  Yes.  with intent/plan  Homicidal Thoughts:  No  Memory:  Immediate;   Fair Recent;   Fair Remote;   Fair  Judgement:  Poor  Insight:  Fair  Psychomotor Activity:  Decreased  Concentration: Concentration:  Fair and Attention Span: Fair  Recall:  Fis[employer]Fair  Fund of Knowledge:Fair  Language: Good  Akathisia:  No  Handed:  Right  AIMS (if indicated):     Assets:  Housing Intimacy Leisure Time Physical Health Resilience Social Support  Sleep:       Musculoskeletal: Strength & Muscle Tone: within normal limits Gait & Station: normal Patient leans: N/A  There were no vitals taken for this visit.  Recommendations:  Based on my evaluation the patient does not appear to have an emergency medical condition.  Nanine MeansLORD, JAMISON, NP 11/09/2015, 2:24 PM   Agree with NP note and assessment

## 2015-11-09 NOTE — Tx Team (Signed)
Initial Treatment Plan 11/09/2015 4:54 PM Sarah Good [id number]    PATIENT STRESSORS: Marital or family conflict   PATIENT STRENGTHS: Average or above average intelligence Capable of independent living Communication skills General fund of knowledge Physical Health Supportive family/friends Work skills   PATIENT IDENTIFIED PROBLEMS: Patient's goals:  "To not want to kill myself."  "To start eating more."                   DISCHARGE CRITERIA:  Improved stabilization in mood, thinking, and/or behavior Need for constant or close observation no longer present Reduction of life-threatening or endangering symptoms to within safe limits Verbal commitment to aftercare and medication compliance  PRELIMINARY DISCHARGE PLAN: Outpatient therapy Return to previous living arrangement Return to previous work or school arrangements  PATIENT/FAMILY INVOLVEMENT: This treatment plan has been presented to and reviewed with the patient, Sarah Good, and/or family member.  The patient and family have been given the opportunity to ask questions and make suggestions.  Sarah Good, Sarah Kidane Eakes, RN 11/09/2015, 4:54 PM

## 2015-11-09 NOTE — BH Assessment (Addendum)
Tele Assessment Note   Sarah Good is an 25 y.o. female. Pt reports SI with a plan to overdose or "whatever I can get my hands on." Pt denies HI/AVH. Pt reports ongoing issues with her friends and family. Pt reports recurrent depression. Pt states he depression began to worsen this month. Pt states she has also been diagnosed with the following: anxiety, EDNOS, borderline personality, and ADHD. Pt states she was last hospitalized in January [place] in [place] and SI. Pt reports a physical health diagnosis of Mitral Valve Prolaspe and Migraines. Pt states she does not take care of hygiene because she does not care.  Writer consulted with Sarah NottinghamJamison, DNP. Per Sarah Good Pt meets inpatient criteria. TTS to seek placement.   Sarah Good completed MSE  Diagnosis:  F33.2 MDD, recurrent, severe  Past Medical History:  Past Medical History:  Diagnosis Date  . Migraine   . Mitral valve prolapse     Past Surgical History:  Procedure Laterality Date  . WISDOM TOOTH EXTRACTION      Family History:  Family History  Problem Relation Age of Onset  . Adopted: Yes    Social History:  reports that she has never smoked. She has never used smokeless tobacco. She reports that she drinks alcohol. She reports that she uses drugs, including Marijuana.  Additional Social History:  Alcohol / Drug Use Pain Medications: Pt denies Prescriptions: Pt denies Over the Counter: Pt denies History of alcohol / drug use?: No history of alcohol / drug abuse Longest period of sobriety (when/how long): NA  CIWA:   COWS:    PATIENT STRENGTHS: (choose at least two) Average or above average intelligence Communication skills  Allergies:  Allergies  Allergen Reactions  . Iodine Anaphylaxis    Home Medications:  No prescriptions prior to admission.    OB/GYN Status:  No LMP recorded. Patient is not currently having periods (Reason: IUD).  General Assessment Data [facility]  Assessment Services TTS Assessment: In system Is this a Tele or Face-to-Face Assessment?: Face-to-Face Is this an Initial Assessment or a Re-assessment for this encounter?: Initial Assessment Marital status: Single Maiden name: NA Is patient pregnant?: No Pregnancy Status: No Living Arrangements: Non-relatives/Friends Can pt return to current living arrangement?: Yes Admission Status: Voluntary Is patient capable of signing voluntary admission?: Yes Referral Source: Self/Family/Friend Insurance type: [employer]  Medical Screening Exam [facility] ONLY) Medical Exam completed: Yes (completed by Sarah MartinetJamison, DNP)  Crisis Care Plan Living Arrangements: Non-relatives/Friends Legal Guardian: Other: (self) Name of Psychiatrist: NA Name of Therapist: NA  Education Status Is patient currently in school?: No Current Grade: NA Highest grade of school patient has completed: some college Name of school: NA Contact person: NA  Risk to self with the past 6 months Suicidal Ideation: Yes-Currently Present Has patient been a risk to self within the past 6 months prior to admission? : No Suicidal Intent: Yes-Currently Present Has patient had any suicidal intent within the past 6 months prior to admission? : No Is patient at risk for suicide?: Yes Suicidal Plan?: Yes-Currently Present Has patient had any suicidal plan within the past 6 months prior to admission? : Yes Specify Current Suicidal Plan: to overdose or do what I can get my hands on Access to Means: Yes Specify Access to Suicidal Means: access to roomate's pills What has been your use of drugs/alcohol within the last 12 months?: NA Previous Attempts/Gestures: No How many times?: 0 Other Self Harm Risks: NA Triggers for  Past Attempts: None known Intentional Self Injurious Behavior: None Family Suicide History: No Recent stressful life event(s): Conflict (Comment), Other (Comment) (job issues) Persecutory voices/beliefs?:  No Depression: Yes Depression Symptoms: Loss of interest in usual pleasures, Feeling worthless/self pity, Feeling angry/irritable, Tearfulness Substance abuse history and/or treatment for substance abuse?: No Suicide prevention information given to non-admitted patients: Not applicable  Risk to Others within the past 6 months Homicidal Ideation: No Does patient have any lifetime risk of violence toward others beyond the six months prior to admission? : No Thoughts of Harm to Others: No Current Homicidal Intent: No Current Homicidal Plan: No Access to Homicidal Means: No Identified Victim: NA History of harm to others?: No Assessment of Violence: None Noted Violent Behavior Description: NA Does patient have access to weapons?: No Criminal Charges Pending?: No Does patient have a court date: No Is patient on probation?: No  Psychosis Hallucinations: None noted Delusions: None noted  Mental Status Report Appearance/Hygiene: Disheveled, Poor hygiene Eye Contact: Fair Motor Activity: Freedom of movement Speech: Logical/coherent Level of Consciousness: Alert Mood: Depressed, Sad Affect: Depressed, Sad Anxiety Level: Minimal Thought Processes: Coherent, Relevant Judgement: Unimpaired Orientation: Place, Person, Time, Situation, Appropriate for developmental age Obsessive Compulsive Thoughts/Behaviors: None  Cognitive Functioning Concentration: Normal Memory: Recent Intact, Remote Intact IQ: Average Insight: Fair Impulse Control: Fair Appetite: Poor Weight Loss: 0 Weight Gain: 0 Sleep: Decreased Total Hours of Sleep: 0 Vegetative Symptoms: None  ADLScreening [facility] Assessment Services) Patient's cognitive ability adequate to safely complete daily activities?: Yes Patient able to express need for assistance with ADLs?: Yes Independently performs ADLs?: Yes (appropriate for developmental age)  Prior Inpatient Therapy Prior Inpatient Therapy: Yes Prior Therapy Dates:  2017 Prior Therapy Facilty/Provider(s): In [place] Reason for Treatment: depression  Prior Outpatient Therapy Prior Outpatient Therapy: No Prior Therapy Dates: NA Prior Therapy Facilty/Provider(s): NA Reason for Treatment: NA Does patient have an ACCT team?: No Does patient have Intensive In-House Services?  : No Does patient have [facility] services? : No Does patient have P4CC services?: No  ADL Screening (condition at time of admission) Patient's cognitive ability adequate to safely complete daily activities?: Yes Is the patient deaf or have difficulty hearing?: No Does the patient have difficulty seeing, even when wearing glasses/contacts?: No Does the patient have difficulty concentrating, remembering, or making decisions?: No Patient able to express need for assistance with ADLs?: Yes Independently performs ADLs?: Yes (appropriate for developmental age) Does the patient have difficulty walking or climbing stairs?: No Weakness of Legs: None Weakness of Arms/Hands: None       Abuse/Neglect Assessment (Assessment to be complete while patient is alone) Physical Abuse: Denies Verbal Abuse: Denies Sexual Abuse: Denies Self-Neglect: Denies     Advance Directives (For Healthcare) Does patient have an advance directive?: No Would patient like information on creating an advanced directive?: No - patient declined information    Additional Information 1:1 In Past 12 Months?: No CIRT Risk: No Elopement Risk: No Does patient have medical clearance?: Yes     Disposition:  Disposition Initial Assessment Completed for this Encounter: Yes Disposition of Patient: Inpatient treatment program Type of inpatient treatment program: Adult  Leisl Spurrier D 11/09/2015 12:20 PM

## 2015-11-10 LAB — COMPREHENSIVE METABOLIC PANEL
ALK PHOS: 56 U/L (ref 38–126)
ALT: 13 U/L — AB (ref 14–54)
AST: 16 U/L (ref 15–41)
Albumin: 4.1 g/dL (ref 3.5–5.0)
Anion gap: 5 (ref 5–15)
BUN: 8 mg/dL (ref 6–20)
CALCIUM: 8.9 mg/dL (ref 8.9–10.3)
CHLORIDE: 107 mmol/L (ref 101–111)
CO2: 28 mmol/L (ref 22–32)
CREATININE: 0.86 mg/dL (ref 0.44–1.00)
Glucose, Bld: 95 mg/dL (ref 65–99)
Potassium: 3.7 mmol/L (ref 3.5–5.1)
Sodium: 140 mmol/L (ref 135–145)
Total Bilirubin: 0.5 mg/dL (ref 0.3–1.2)
Total Protein: 6.9 g/dL (ref 6.5–8.1)

## 2015-11-10 LAB — CBC WITH DIFFERENTIAL/PLATELET
Basophils Absolute: 0.1 10*3/uL (ref 0.0–0.1)
Basophils Relative: 1 %
EOS ABS: 0.4 10*3/uL (ref 0.0–0.7)
EOS PCT: 5 %
HCT: 40.8 % (ref 36.0–46.0)
HEMOGLOBIN: 13.9 g/dL (ref 12.0–15.0)
LYMPHS ABS: 3 10*3/uL (ref 0.7–4.0)
Lymphocytes Relative: 43 %
MCH: 30.5 pg (ref 26.0–34.0)
MCHC: 34.1 g/dL (ref 30.0–36.0)
MCV: 89.5 fL (ref 78.0–100.0)
MONOS PCT: 9 %
Monocytes Absolute: 0.7 10*3/uL (ref 0.1–1.0)
NEUTROS PCT: 42 %
Neutro Abs: 3 10*3/uL (ref 1.7–7.7)
Platelets: 344 10*3/uL (ref 150–400)
RBC: 4.56 MIL/uL (ref 3.87–5.11)
RDW: 12.5 % (ref 11.5–15.5)
WBC: 7.1 10*3/uL (ref 4.0–10.5)

## 2015-11-10 LAB — URINALYSIS, ROUTINE W REFLEX MICROSCOPIC
Bilirubin Urine: NEGATIVE
GLUCOSE, UA: NEGATIVE mg/dL
Hgb urine dipstick: NEGATIVE
Ketones, ur: NEGATIVE mg/dL
LEUKOCYTES UA: NEGATIVE
Nitrite: NEGATIVE
PROTEIN: NEGATIVE mg/dL
SPECIFIC GRAVITY, URINE: 1.015 (ref 1.005–1.030)
pH: 6 (ref 5.0–8.0)

## 2015-11-10 LAB — PREGNANCY, URINE: Preg Test, Ur: NEGATIVE

## 2015-11-10 MED ORDER — TRAZODONE HCL 150 MG PO TABS
150.0000 mg | ORAL_TABLET | Freq: Every day | ORAL | Status: DC
  Administered 2015-11-10 – 2015-11-15 (×6): 150 mg via ORAL
  Filled 2015-11-10 (×2): qty 1
  Filled 2015-11-10: qty 2
  Filled 2015-11-10 (×5): qty 1

## 2015-11-10 MED ORDER — BUSPIRONE HCL 5 MG PO TABS
5.0000 mg | ORAL_TABLET | Freq: Three times a day (TID) | ORAL | Status: DC
  Administered 2015-11-10 – 2015-11-11 (×4): 5 mg via ORAL
  Filled 2015-11-10 (×10): qty 1

## 2015-11-10 MED ORDER — VENLAFAXINE HCL ER 37.5 MG PO CP24
37.5000 mg | ORAL_CAPSULE | Freq: Every day | ORAL | Status: DC
  Administered 2015-11-11 – 2015-11-13 (×3): 37.5 mg via ORAL
  Filled 2015-11-10 (×5): qty 1

## 2015-11-10 NOTE — [facility] Group Notes (Signed)
[facility] LCSW Group Therapy 11/10/2015 1:15pm  Type of Therapy: Group Therapy- Feelings Around Relapse and Recovery  Participation Level: Active   Participation Quality:  Appropriate  Affect:  Appropriate  Cognitive: Alert and Oriented   Insight:  Developing   Engagement in Therapy: Developing/Improving and Engaged   Modes of Intervention: Clarification, Confrontation, Discussion, Education, Exploration, Limit-setting, Orientation, Problem-solving, Rapport Building, Dance movement psychotherapisteality Testing, Socialization and Support  Summary of Progress/Problems: The topic for today was feelings about relapse. The group discussed what relapse prevention is to them and identified triggers that they are on the path to relapse. Members also processed their feeling towards relapse and were able to relate to common experiences. Group also discussed coping skills that can be used for relapse prevention.  Pt participated actively in discussion and was able to identify warning signs and triggers.    Therapeutic Modalities:   Cognitive Behavioral Therapy Solution-Focused Therapy Assertiveness Training Relapse Prevention Therapy    Lamar SprinklesLauren Carter, LCSWA [phone] 11/10/2015 5:08 PM

## 2015-11-10 NOTE — Progress Notes (Signed)
Sarah Good ( as she requests to be called) says " I've been here before..ibuprofen've been through CBT, DBT... mindfullness training". She is pleasant, articulate. Maybe hypomanic at times. She is seen interacting comfortably with her peers in the dayroom. She watches TV, talks on the phone. A She completed her daily assessment and on it she wrote she deneid SI today and she rated her depression, hopelessness and anxiety " 8/7/9",, respectively. R Safety is in oplace.

## 2015-11-10 NOTE — Progress Notes (Signed)
D: Sarah GreathouseRandall who goes by "Sarah Good" describes her day as "so, so". Her goal today was to "get my meds sorted out". She rates Anxiety 7/10 and Depression 7-8/10. She states her anxiety and depression were 10 on admit. She states she had stopped taking her medications prior to admission due to lack of funding. However, at this time she is working full time at [employer] and she plans to continue taking her medication as prescribed.  Denies SI/HI/AVH at this time. Contracts for safety.   A: Encouragement and support given. Q15 minute room checks for patient safety. Medications administered as prescribed.   R: Continue to monitor for patient safety and medication effectiveness.

## 2015-11-10 NOTE — H&P (Signed)
Psychiatric Admission Assessment Adult  Patient Identification: Sarah Good MRN:  [id number] Date of Evaluation:  11/10/2015 Chief Complaint:  MDD Principal Diagnosis: <principal problem not specified> Diagnosis:   Patient Active Problem List   Diagnosis Date Noted  . Major depressive disorder, recurrent severe without psychotic features (HCC) [F33.2] 11/09/2015  . Pyelonephritis [N12] 09/20/2015  . Nausea [R11.0] 09/20/2015  . Leukocytosis [D72.829] 09/20/2015  . Hypokalemia [E87.6] 09/20/2015  . Anxiety [F41.9] 09/20/2015  . Nephrolithiasis [N20.0]    History of Present Illness: 25 year old single female with long history of multiple depressive and anxiety symptoms including suicide ieadtions who is voluntarily admitted here because of escalating symptoms over the past several months.  Current stressors includes starting a new job and a lot of financial stress. She lives with 2 room-mate. Patient wanted to overdose with a friends klonopin Associated Signs/Symptoms: Depression Symptoms:  depressed mood, suicidal thoughts with specific plan, (Hypo) Manic Symptoms:  None Anxiety Symptoms:  Excessive Worry, Psychotic Symptoms:  None PTSD Symptoms: None Total Time spent with patient: 1 hour  Past Psychiatric History: Has had 3 psychiatric hospitalizations , last in 02/2015  Is the patient at risk to self? Yes.    Has the patient been a risk to self in the past 6 months? Yes.    Has the patient been a risk to self within the distant past? Yes.    Is the patient a risk to others? No.  Has the patient been a risk to others in the past 6 months? No.  Has the patient been a risk to others within the distant past? No.   Prior Inpatient Therapy: Prior Inpatient Therapy: Yes Prior Therapy Dates: 2017 Prior Therapy Facilty/Provider(s): In [place] Reason for Treatment: depression Prior Outpatient Therapy: Prior Outpatient Therapy: No Prior Therapy Dates: NA Prior Therapy  Facilty/Provider(s): NA Reason for Treatment: NA Does patient have an ACCT team?: No Does patient have Intensive In-House Services?  : No Does patient have [facility] services? : No Does patient have P4CC services?: No  Alcohol Screening: 1. How often do you have a drink containing alcohol?: Monthly or less 2. How many drinks containing alcohol do you have on a typical day when you are drinking?: 1 or 2 3. How often do you have six or more drinks on one occasion?: Never Preliminary Score: 0 9. Have you or someone else been injured as a result of your drinking?: No 10. Has a relative or friend or a doctor or another health worker been concerned about your drinking or suggested you cut down?: No Alcohol Use Disorder Identification Test Final Score (AUDIT): 1 Brief Intervention: AUDIT score less than 7 or less-screening does not suggest unhealthy drinking-brief intervention not indicated Substance Abuse History in the last 12 months:  No. Consequences of Substance Abuse: NA Previous Psychotropic Medications: Yes  Psychological Evaluations: No  Past Medical History:  Past Medical History:  Diagnosis Date  . Migraine   . Mitral valve prolapse     Past Surgical History:  Procedure Laterality Date  . WISDOM TOOTH EXTRACTION     Family History:  Family History  Problem Relation Age of Onset  . Adopted: Yes   Family Psychiatric  History: Adopted abd does not know Tobacco Screening: Have you used any form of tobacco in the last 30 days? (Cigarettes, Smokeless Tobacco, Cigars, and/or Pipes): No Social History:  History  Alcohol Use  . Yes    Comment: socially     History  Drug Use  .  Types: Marijuana    Comment: Second-hand    Additional Social History: Marital status: Single    Pain Medications: Pt denies Prescriptions: Pt denies Over the Counter: Pt denies History of alcohol / drug use?: No history of alcohol / drug abuse Longest period of sobriety (when/how long): NA                     Allergies:   Allergies  Allergen Reactions  . Iodine Anaphylaxis   Lab Results: No results found for this or any previous visit (from the past 48 hour(s)).  Blood Alcohol level:  No results found for: [facility]  Metabolic Disorder Labs:  No results found for: HGBA1C, MPG No results found for: PROLACTIN No results found for: CHOL, TRIG, HDL, CHOLHDL, VLDL, LDLCALC  Current Medications: Current Facility-Administered Medications  Medication Dose Route Frequency Provider Last Rate Last Dose  . acetaminophen (TYLENOL) tablet 650 mg  650 mg Oral Q6H PRN Charm Rings, NP   650 mg at 11/09/15 1617  . alum & mag hydroxide-simeth (MAALOX/MYLANTA) 200-200-20 MG/5ML suspension 30 mL  30 mL Oral Q4H PRN Charm Rings, NP      . butalbital-acetaminophen-caffeine (FIORICET, ESGIC) [phone] MG per tablet 2 tablet  2 tablet Oral Q6H PRN Charm Rings, NP      . hydrOXYzine (ATARAX/VISTARIL) tablet 25 mg  25 mg Oral Q6H PRN Jackelyn Poling, NP   25 mg at 11/09/15 2230  . Influenza vac split quadrivalent PF (FLUARIX) injection 0.5 mL  0.5 mL Intramuscular Tomorrow-1000 [name], MD      . magnesium hydroxide (MILK OF MAGNESIA) suspension 30 mL  30 mL Oral Daily PRN Charm Rings, NP      . traZODone (DESYREL) tablet 100 mg  100 mg Oral QHS Jackelyn Poling, NP   100 mg at 11/09/15 2229  . traZODone (DESYREL) tablet 50 mg  50 mg Oral QHS PRN Jackelyn Poling, NP       PTA Medications: Prescriptions Prior to Admission  Medication Sig Dispense Refill Last Dose  . fexofenadine (ALLEGRA ALLERGY) 180 MG tablet Take 180 mg by mouth daily.   11/08/2015  . ibuprofen (ADVIL,MOTRIN) 200 MG tablet Take 400 mg by mouth every 6 (six) hours as needed for moderate pain.    11/08/2015  . Levonorgestrel (MIRENA, 52 MG, IU) 1 Device by Intrauterine route once.   more than a month  . ondansetron (ZOFRAN) 4 MG tablet Take 1 tablet (4 mg total) by mouth every 8 (eight) hours as needed for nausea or  vomiting. (Patient not taking: Reported on 11/09/2015) 12 tablet 0     Musculoskeletal: Strength & Muscle Tone: within normal limits Gait & Station: normal Patient leans: N/A  Psychiatric Specialty Exam: Physical Exam  Constitutional: She appears well-developed and well-nourished.  HENT:  Head: Normocephalic and atraumatic.  Cardiovascular: Normal rate.   Psychiatric: She expresses impulsivity.    ROS  Blood pressure 118/77, pulse 71, temperature 98.3 F (36.8 C), temperature source Oral, resp. rate 18, height 5\' 4"  (1.626 m), weight 72.6 kg (160 lb), SpO2 100 %.Body mass index is 27.46 kg/m.  General Appearance: Casual  Eye Contact:  Good  Speech:  Clear and Coherent and Normal Rate  Volume:  Normal  Mood:  Anxious, Depressed and Dysphoric  Affect:  Depressed  Thought Process:  Coherent and Goal Directed  Orientation:  Full (Time, Place, and Person)  Thought Content:  Logical  Suicidal Thoughts:  Yes.  with intent/plan  Homicidal Thoughts:  No  Memory:  Immediate;   Fair Recent;   Fair Remote;   Fair  Judgement:  Fair  Insight:  Fair  Psychomotor Activity:  Normal  Concentration:  Concentration: Fair and Attention Span: Fair  Recall:  [employer]Fair  Fund of Knowledge:  Fair  Language:  Fair  Akathisia:  No  Handed:  Right  AIMS (if indicated):     Assets:  Communication Skills Desire for Improvement Financial Resources/Insurance Housing Physical Health Social Support Transportation  ADL's:  Intact  Cognition:  WNL  Sleep:  Number of Hours: 6.75    Treatment Plan Summary: Daily contact with patient to assess and evaluate symptoms and progress in treatment and Medication management  Observation Level/Precautions:  15 minute checks  Laboratory:  CBC Chemistry Profile HCG UA  Psychotherapy:    Medications:    Consultations:    Discharge Concerns:    Estimated LOS:  Other:     Physician Treatment Plan for Primary Diagnosis: <principal problem not  specified> Long Term Goal(s): Improvement in symptoms so as ready for discharge  Short Term Goals: Ability to identify changes in lifestyle to reduce recurrence of condition will improve, Ability to verbalize feelings will improve, Ability to disclose and discuss suicidal ideas, Ability to demonstrate self-control will improve, Ability to identify and develop effective coping behaviors will improve, Ability to maintain clinical measurements within normal limits will improve, Compliance with prescribed medications will improve and Ability to identify triggers associated with substance abuse/mental health issues will improve  Physician Treatment Plan for Secondary Diagnosis: Active Problems:   Major depressive disorder, recurrent severe without psychotic features (HCC)  Long Term Goal(s): Improvement in symptoms so as ready for discharge  Short Term Goals: Ability to identify changes in lifestyle to reduce recurrence of condition will improve, Ability to verbalize feelings will improve, Ability to disclose and discuss suicidal ideas, Ability to demonstrate self-control will improve, Ability to identify and develop effective coping behaviors will improve, Ability to maintain clinical measurements within normal limits will improve, Compliance with prescribed medications will improve and Ability to identify triggers associated with substance abuse/mental health issues will improve  I certify that inpatient services furnished can reasonably be expected to improve the patient's condition.    Wynelle BourgeoisUreh N Lekauwa, MD 9/29/201711:19 AM

## 2015-11-10 NOTE — Progress Notes (Signed)
D: Pt endorsed severe depression and anxiety; states, "I may look like I'm fine from outside but I feel super anxious and depressed right now." Pt is very animated and hyperactive. Pt denied SI, HI or pain. A: Medications offered as prescribed.  Support, encouragement, and safe environment provided.  15-minute safety checks continue. R: Pt was med compliant.  Pt attended karaoke group. Safety checks continue.

## 2015-11-10 NOTE — Tx Team (Signed)
Interdisciplinary Treatment and Diagnostic Plan Update  11/10/2015 Time of Session: 2:39 PM  Sarah Good MRN: [id number]  Principal Diagnosis: MDD  Secondary Diagnoses: Active Problems:   Major depressive disorder, recurrent severe without psychotic features ([name])   Current Medications:  Current Facility-Administered Medications  Medication Dose Route Frequency Provider Last Rate Last Dose  . acetaminophen (TYLENOL) tablet 650 mg  650 mg Oral Q6H PRN Patrecia Pour, NP   650 mg at 11/09/15 1617  . alum & mag hydroxide-simeth (MAALOX/MYLANTA) 200-200-20 MG/5ML suspension 30 mL  30 mL Oral Q4H PRN Patrecia Pour, NP      . busPIRone (BUSPAR) tablet 5 mg  5 mg Oral TID Sueanne Margarita, MD   5 mg at 11/10/15 1331  . butalbital-acetaminophen-caffeine (FIORICET, ESGIC) 50-325-40 MG per tablet 2 tablet  2 tablet Oral Q6H PRN Patrecia Pour, NP      . hydrOXYzine (ATARAX/VISTARIL) tablet 25 mg  25 mg Oral Q6H PRN Rozetta Nunnery, NP   25 mg at 11/09/15 2230  . Influenza vac split quadrivalent PF (FLUARIX) injection 0.5 mL  0.5 mL Intramuscular Tomorrow-1000 [name], MD      . magnesium hydroxide (MILK OF MAGNESIA) suspension 30 mL  30 mL Oral Daily PRN Patrecia Pour, NP      . traZODone (DESYREL) tablet 150 mg  150 mg Oral QHS Sueanne Margarita, MD      . Derrill Memo ON 11/11/2015] venlafaxine XR (EFFEXOR-XR) 24 hr capsule 37.5 mg  37.5 mg Oral Q breakfast Sueanne Margarita, MD        PTA Medications: Prescriptions Prior to Admission  Medication Sig Dispense Refill Last Dose  . fexofenadine (ALLEGRA ALLERGY) 180 MG tablet Take 180 mg by mouth daily.   11/08/2015  . ibuprofen (ADVIL,MOTRIN) 200 MG tablet Take 400 mg by mouth every 6 (six) hours as needed for moderate pain.    11/08/2015  . Levonorgestrel (MIRENA, 52 MG, IU) 1 Device by Intrauterine route once.   more than a month  . ondansetron (ZOFRAN) 4 MG tablet Take 1 tablet (4 mg total) by mouth every 8 (eight) hours as needed for  nausea or vomiting. (Patient not taking: Reported on 11/09/2015) 12 tablet 0     Treatment Modalities: Medication Management, Group therapy, Case management,  1 to 1 session with clinician, Psychoeducation, Recreational therapy.  Patient Stressors: Marital or family conflict  Patient Strengths: Average or above average intelligence Capable of independent living Communication skills General fund of knowledge Physical Health Supportive family/friends Work Artist for Primary Diagnosis: MDD Long Term Goal(s): Improvement in symptoms so as ready for discharge  Short Term Goals: Ability to identify changes in lifestyle to reduce recurrence of condition will improve, Ability to verbalize feelings will improve, Ability to disclose and discuss suicidal ideas, Ability to demonstrate self-control will improve, Ability to identify and develop effective coping behaviors will improve, Ability to maintain clinical measurements within normal limits will improve, Compliance with prescribed medications will improve and Ability to identify triggers associated with substance abuse/mental health issues will improve  Medication Management: Evaluate patient's response, side effects, and tolerance of medication regimen.  Therapeutic Interventions: 1 to 1 sessions, Unit Group sessions and Medication administration.  Evaluation of Outcomes: Not Met  Physician Treatment Plan for Secondary Diagnosis: Active Problems:   Major depressive disorder, recurrent severe without psychotic features ([place])   Long Term Goal(s): Improvement in symptoms so as ready for discharge  Short  Term Goals: Ability to identify changes in lifestyle to reduce recurrence of condition will improve, Ability to verbalize feelings will improve, Ability to disclose and discuss suicidal ideas, Ability to demonstrate self-control will improve, Ability to identify and develop effective coping behaviors will improve,  Ability to maintain clinical measurements within normal limits will improve, Compliance with prescribed medications will improve and Ability to identify triggers associated with substance abuse/mental health issues will improve  Medication Management: Evaluate patient's response, side effects, and tolerance of medication regimen.  Therapeutic Interventions: 1 to 1 sessions, Unit Group sessions and Medication administration.  Evaluation of Outcomes: Not Met   RN Treatment Plan for Primary Diagnosis: MDD Long Term Goal(s): Knowledge of disease and therapeutic regimen to maintain health will improve  Short Term Goals: Ability to remain free from injury will improve, Ability to verbalize feelings will improve, Ability to disclose and discuss suicidal ideas and Ability to identify and develop effective coping behaviors will improve  Medication Management: RN will administer medications as ordered by provider, will assess and evaluate patient's response and provide education to patient for prescribed medication. RN will report any adverse and/or side effects to prescribing provider.  Therapeutic Interventions: 1 on 1 counseling sessions, Psychoeducation, Medication administration, Evaluate responses to treatment, Monitor vital signs and CBGs as ordered, Perform/monitor CIWA, COWS, AIMS and Fall Risk screenings as ordered, Perform wound care treatments as ordered.  Evaluation of Outcomes: Not Met   LCSW Treatment Plan for Primary Diagnosis: MDD Long Term Goal(s): Safe transition to appropriate next level of care at discharge, Engage patient in therapeutic group addressing interpersonal concerns.  Short Term Goals: Engage patient in aftercare planning with referrals and resources, Increase emotional regulation, Facilitate acceptance of mental health diagnosis and concerns and Increase skills for wellness and recovery  Therapeutic Interventions: Assess for all discharge needs, 1 to 1 time with  Social worker, Explore available resources and support systems, Assess for adequacy in community support network, Educate family and significant other(s) on suicide prevention, Complete Psychosocial Assessment, Interpersonal group therapy.  Evaluation of Outcomes: Not Met   Progress in Treatment: Attending groups: Yes Participating in groups: Yes Taking medication as prescribed: Yes, MD continues to assess for medication changes as needed Toleration medication: Yes, no side effects reported at this time Family/Significant other contact made: No, CSW assessing for appropriate contact Patient understands diagnosis: Continuing to assess Discussing patient identified problems/goals with staff: Yes Medical problems stabilized or resolved: Yes Denies suicidal/homicidal ideation: Yes Issues/concerns per patient self-inventory: None Other: N/A  New problem(s) identified: None identified at this time.   New Short Term/Long Term Goal(s): None identified at this time.   Discharge Plan or Barriers: Pt will return home and follow-up with outpatient services.   Reason for Continuation of Hospitalization: Anxiety Depression Medication stabilization Suicidal ideation  Estimated Length of Stay: 3-5 days  Attendees: Patient: 11/10/2015  2:39 PM  Physician: Dr. Ananias Pilgrim 11/10/2015  2:39 PM  Nursing: Sandre Kitty, RN 11/10/2015  2:39 PM  RN Care Manager: Lars Pinks, RN 11/10/2015  2:39 PM  Social Worker: Peri Maris, LCSW; Erasmo Downer Drinkard, LCSW 11/10/2015  2:39 PM  Recreational Therapist:  11/10/2015  2:39 PM  Other: Lindell Spar, NP 11/10/2015  2:39 PM  Other:  11/10/2015  2:39 PM  Other: 11/10/2015  2:39 PM    Scribe for Treatment Team: Bo Mcclintock, LCSW 11/10/2015 2:39 PM
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
## 2015-11-11 DIAGNOSIS — Z791 Long term (current) use of non-steroidal anti-inflammatories (NSAID): Secondary | ICD-10-CM

## 2015-11-11 DIAGNOSIS — R45851 Suicidal ideations: Secondary | ICD-10-CM

## 2015-11-11 DIAGNOSIS — Z79899 Other long term (current) drug therapy: Secondary | ICD-10-CM

## 2015-11-11 DIAGNOSIS — F332 Major depressive disorder, recurrent severe without psychotic features: Principal | ICD-10-CM

## 2015-11-11 MED ORDER — BUSPIRONE HCL 10 MG PO TABS
10.0000 mg | ORAL_TABLET | Freq: Three times a day (TID) | ORAL | Status: DC
  Administered 2015-11-11 – 2015-11-13 (×6): 10 mg via ORAL
  Filled 2015-11-11 (×7): qty 1
  Filled 2015-11-11: qty 2
  Filled 2015-11-11 (×4): qty 1

## 2015-11-11 NOTE — [facility] Group Notes (Signed)
Adult Therapy Group Note  Date: 11/11/2015 Time:  10:00-11:00AM  Group Topic/Focus: Unhealthy Coping Skills versus Healthy Coping Skills  Building Self Esteem:   The Focus of this group was to assist patients in becoming aware of the differences between healthy and unhealthy coping techniques, as well as how to determine which type they are using.   Reasons for choosing the unhealthy techniques were explored, which helped patients to provide support to each other and to determine that, in fact, they are not alone.  Participation Level:  Active  Participation Quality:  Attentive and Sharing  Affect:  Blunted  Cognitive:  Appropriate  Insight: Good  Engagement in Group:  Engaged  Modes of Intervention:  Discussion, Exploration and Support  Additional Comments:  The patient expressed that isolation and stopping eating are two unhealthy coping techniques she uses.  She displayed considerable insight and was supportive at times and educational at times with other people in the group.  Carloyn JaegerMareida J Grossman-Orr 11/11/2015, 12:35 PM

## 2015-11-11 NOTE — Progress Notes (Signed)
D [name] is doing better today... She says she is feeling better...that the buspar is helping her handle her high level of anxiety . She attends her groups and she participates in Life SKills and even offers suggestions to her peers on how to handle high levels of anxiety. A SHe completes her daily assessments and on it she wrote she deneid SI today and she rated her depressionj, hopelessness and anxeity " 6/5/7", respectively. She says the tylenol she received this morning helped her headache and she is observed working on her cbt homework this afternoon. R Safety is in place.

## 2015-11-11 NOTE — [facility] Group Notes (Signed)
[facility] Group Notes:  (Nursing/MHT/Case Management/Adjunct)  Date:  11/11/2015  Time:  2:39 PM  Type of Therapy:  Psychoeducational Skills  Participation Level:  Active  Participation Quality:  Appropriate  Affect:  Appropriate  Cognitive:  Appropriate  Insight:  Appropriate  Engagement in Group:  Engaged  Modes of Intervention:  Discussion  Summary of Progress/Problems: Pt did attend life skills group.     Sarah Good 11/11/2015, 2:39 PM 

## 2015-11-11 NOTE — Progress Notes (Signed)
Adult Psychoeducational Group Note  Date:  11/11/2015 Time:  12:18 AM  Group Topic/Focus:  Wrap-Up Group:   The focus of this group is to help patients review their daily goal of treatment and discuss progress on daily workbooks.   Participation Level:  Active  Participation Quality:  Appropriate  Affect:  Appropriate  Cognitive:  Appropriate  Insight: Appropriate  Engagement in Group:  Engaged  Modes of Intervention:  Discussion  Additional Comments:  Pt stated that goal for today was to meet with MD and discuss medications. Pt stated that she was able to speak to MD. Pt rated today with a 5. Deborha Moseley, Alfredia ClientAndreia 11/11/2015, 12:18 AM

## 2015-11-11 NOTE — Progress Notes (Signed)
Adult Psychoeducational Group Note  Date:  11/11/2015 Time:  11:27 PM  Group Topic/Focus:  Wrap-Up Group:   The focus of this group is to help patients review their daily goal of treatment and discuss progress on daily workbooks.   Participation Level:  Active  Participation Quality:  Appropriate  Affect:  Appropriate  Cognitive:  Appropriate  Insight: Appropriate  Engagement in Group:  Engaged  Modes of Intervention:  Discussion  Additional Comments:  Patient attended group and said that her day was a 8.  Her goal for today was to learn new coping skills. Her coping skill that she applied today was socializing.   Quynh Basso W Lisa Milian 11/11/2015, 11:27 PM

## 2015-11-11 NOTE — [facility] Group Notes (Signed)
[facility] Group Notes:  (Nursing/MHT/Case Management/Adjunct)  Date:  11/11/2015  Time:  11:04 AM  Type of Therapy:  Psychoeducational Skills  Participation Level:  Active  Participation Quality:  Appropriate  Affect:  Appropriate  Cognitive:  Appropriate  Insight:  Appropriate  Engagement in Group:  Engaged  Modes of Intervention:  Discussion  Summary of Progress/Problems: Pt did attend self inventory group.     Jacquelyne BalintForrest, Crystalann Korf Shanta 11/11/2015, 11:04 AM

## 2015-11-11 NOTE — Progress Notes (Signed)
[facility] MD Progress Note  11/11/2015 4:26 PM Summar Sarah Good  MRN:  [id number]  Subjective: Sarah Good reports, "I feel very anxious, chest tightness & racing thoughts. I continue to feel suicidal but I feel safe here".   Objective: 25 year old single female with long history of multiple depressive and anxiety symptoms including suicide ieadtions who is voluntarily admitted here because of escalating symptoms over the past several months.  Current stressors includes starting a new job and a lot of financial stress. She lives with 2 room-mate. Patient wanted to overdose with a friends klonopin  Principal Problem: Major depressive disorder, recurrent, severe without Psychosis.  Diagnosis:   Patient Active Problem List   Diagnosis Date Noted  . Major depressive disorder, recurrent severe without psychotic features ([place]) [F33.2] 11/09/2015  . Pyelonephritis [N12] 09/20/2015  . Nausea [R11.0] 09/20/2015  . Leukocytosis [D72.829] 09/20/2015  . Hypokalemia [E87.6] 09/20/2015  . Anxiety [F41.9] 09/20/2015  . Nephrolithiasis [N20.0]    Total Time spent with patient: 25 minutes  Past Psychiatric History: Mdd  Past Medical History:  Past Medical History:  Diagnosis Date  . Migraine   . Mitral valve prolapse     Past Surgical History:  Procedure Laterality Date  . WISDOM TOOTH EXTRACTION     Family History:  Family History  Problem Relation Age of Onset  . Adopted: Yes   Family Psychiatric  History: See H&P  Social History:  History  Alcohol Use  . Yes    Comment: socially     History  Drug Use  . Types: Marijuana    Comment: Second-hand    Social History   Social History  . Marital status: Single    Spouse name: N/A  . Number of children: N/A  . Years of education: N/A   Social History Main Topics  . Smoking status: Never Smoker  . Smokeless tobacco: Never Used  . Alcohol use Yes     Comment: socially  . Drug use:     Types: Marijuana     Comment: Second-hand  .  Sexual activity: Not Asked   Other Topics Concern  . None   Social History Narrative  . None   Additional Social History:    Pain Medications: Pt denies Prescriptions: Pt denies Over the Counter: Pt denies History of alcohol / drug use?: No history of alcohol / drug abuse Longest period of sobriety (when/how long): NA  Sleep: Fair  Appetite:  Fair  Current Medications: Current Facility-Administered Medications  Medication Dose Route Frequency Provider Last Rate Last Dose  . acetaminophen (TYLENOL) tablet 650 mg  650 mg Oral Q6H PRN Patrecia Pour, NP   650 mg at 11/11/15 7741  . alum & mag hydroxide-simeth (MAALOX/MYLANTA) 200-200-20 MG/5ML suspension 30 mL  30 mL Oral Q4H PRN Patrecia Pour, NP      . busPIRone (BUSPAR) tablet 5 mg  5 mg Oral TID Sueanne Margarita, MD   5 mg at 11/11/15 1215  . butalbital-acetaminophen-caffeine (FIORICET, ESGIC) 50-325-40 MG per tablet 2 tablet  2 tablet Oral Q6H PRN Patrecia Pour, NP      . hydrOXYzine (ATARAX/VISTARIL) tablet 25 mg  25 mg Oral Q6H PRN Rozetta Nunnery, NP   25 mg at 11/09/15 2230  . Influenza vac split quadrivalent PF (FLUARIX) injection 0.5 mL  0.5 mL Intramuscular Tomorrow-1000 [name], MD      . magnesium hydroxide (MILK OF MAGNESIA) suspension 30 mL  30 mL Oral Daily PRN  Patrecia Pour, NP      . traZODone (DESYREL) tablet 150 mg  150 mg Oral QHS Sueanne Margarita, MD   150 mg at 11/10/15 2224  . venlafaxine XR (EFFEXOR-XR) 24 hr capsule 37.5 mg  37.5 mg Oral Q breakfast Sueanne Margarita, MD   37.5 mg at 11/11/15 9179   Lab Results:  Results for orders placed or performed during the hospital encounter of 11/09/15 (from the past 48 hour(s))  Pregnancy, urine     Status: None   Collection Time: 11/10/15 11:37 AM  Result Value Ref Range   Preg Test, Ur NEGATIVE NEGATIVE    Comment:        THE SENSITIVITY OF THIS METHODOLOGY IS >20 mIU/mL. Performed at [facility]   Urinalysis, Routine w reflex  microscopic (not at [facility])     Status: None   Collection Time: 11/10/15 11:37 AM  Result Value Ref Range   Color, Urine YELLOW YELLOW   APPearance CLEAR CLEAR   Specific Gravity, Urine 1.015 1.005 - 1.030   pH 6.0 5.0 - 8.0   Glucose, UA NEGATIVE NEGATIVE mg/dL   Hgb urine dipstick NEGATIVE NEGATIVE   Bilirubin Urine NEGATIVE NEGATIVE   Ketones, ur NEGATIVE NEGATIVE mg/dL   Protein, ur NEGATIVE NEGATIVE mg/dL   Nitrite NEGATIVE NEGATIVE   Leukocytes, UA NEGATIVE NEGATIVE    Comment: MICROSCOPIC NOT DONE ON URINES WITH NEGATIVE PROTEIN, BLOOD, LEUKOCYTES, NITRITE, OR GLUCOSE <1000 mg/dL. Performed at [facility]   Comprehensive metabolic panel     Status: Abnormal   Collection Time: 11/10/15  6:17 PM  Result Value Ref Range   Sodium 140 135 - 145 mmol/L   Potassium 3.7 3.5 - 5.1 mmol/L   Chloride 107 101 - 111 mmol/L   CO2 28 22 - 32 mmol/L   Glucose, Bld 95 65 - 99 mg/dL   BUN 8 6 - 20 mg/dL   Creatinine, Ser 0.86 0.44 - 1.00 mg/dL   Calcium 8.9 8.9 - 10.3 mg/dL   Total Protein 6.9 6.5 - 8.1 g/dL   Albumin 4.1 3.5 - 5.0 g/dL   AST 16 15 - 41 U/L   ALT 13 (L) 14 - 54 U/L   Alkaline Phosphatase 56 38 - 126 U/L   Total Bilirubin 0.5 0.3 - 1.2 mg/dL   GFR calc non Af Amer >60 >60 mL/min   GFR calc Af Amer >60 >60 mL/min    Comment: (NOTE) The eGFR has been calculated using the CKD EPI equation. This calculation has not been validated in all clinical situations. eGFR's persistently <60 mL/min signify possible Chronic Kidney Disease.    Anion gap 5 5 - 15    Comment: Performed at [facility]  CBC with Differential/Platelet     Status: None   Collection Time: 11/10/15  6:17 PM  Result Value Ref Range   WBC 7.1 4.0 - 10.5 K/uL   RBC 4.56 3.87 - 5.11 MIL/uL   Hemoglobin 13.9 12.0 - 15.0 g/dL   HCT 40.8 36.0 - 46.0 %   MCV 89.5 78.0 - 100.0 fL   MCH 30.5 26.0 - 34.0 pg   MCHC 34.1 30.0 - 36.0 g/dL   RDW 12.5 11.5 - 15.5 %   Platelets  344 150 - 400 K/uL   Neutrophils Relative % 42 %   Neutro Abs 3.0 1.7 - 7.7 K/uL   Lymphocytes Relative 43 %   Lymphs Abs 3.0 0.7 - 4.0 K/uL  Monocytes Relative 9 %   Monocytes Absolute 0.7 0.1 - 1.0 K/uL   Eosinophils Relative 5 %   Eosinophils Absolute 0.4 0.0 - 0.7 K/uL   Basophils Relative 1 %   Basophils Absolute 0.1 0.0 - 0.1 K/uL    Comment: Performed at [facility]   Blood Alcohol level:  No results found for: [facility]  Metabolic Disorder Labs: No results found for: HGBA1C, MPG No results found for: PROLACTIN No results found for: CHOL, TRIG, HDL, CHOLHDL, VLDL, LDLCALC  Physical Findings: AIMS: Facial and Oral Movements Muscles of Facial Expression: None, normal Lips and Perioral Area: None, normal Jaw: None, normal Tongue: None, normal,Extremity Movements Upper (arms, wrists, hands, fingers): None, normal Lower (legs, knees, ankles, toes): None, normal, Trunk Movements Neck, shoulders, hips: None, normal, Overall Severity Severity of abnormal movements (highest score from questions above): None, normal Incapacitation due to abnormal movements: None, normal Patient's awareness of abnormal movements (rate only patient's report): No Awareness, Dental Status Current problems with teeth and/or dentures?: No Does patient usually wear dentures?: No  CIWA:    COWS:     Musculoskeletal: Strength & Muscle Tone: within normal limits Gait & Station: normal Patient leans: N/A  Psychiatric Specialty Exam: Physical Exam  ROS  Blood pressure (!) 101/56, pulse (!) 124, temperature 97.9 F (36.6 C), resp. rate 17, height _0  (1.626 m), weight 72.6 kg (160 lb), SpO2 100 %.Body mass index is 27.46 kg/m.  General Appearance: Casual  Eye Contact:  Good  Speech:  Clear and Coherent and Normal Rate  Volume:  Normal  Mood:  Anxious, Depressed and Dysphoric  Affect:  Depressed  Thought Process:  Coherent and Goal Directed  Orientation:  Full (Time, Place, and  Person)  Thought Content:  Logical  Suicidal Thoughts:  Yes.  with intent/plan  Homicidal Thoughts:  No  Memory:  Immediate;   Fair Recent;   Fair Remote;   Fair  Judgement:  Fair  Insight:  Fair  Psychomotor Activity:  Normal  Concentration:  Concentration: Fair and Attention Span: Fair  Recall:  [employer] of Knowledge:  Fair  Language:  Fair  Akathisia:  No  Handed:  Right  AIMS (if indicated):     Assets:  Communication Skills Desire for Improvement Financial Resources/Insurance Housing Physical Health Social Support Transportation  ADL's:  Intact  Cognition:  WNL  Sleep:  Number of Hours: 6.5     Treatment Plan Summary: Daily contact with patient to assess and evaluate symptoms and progress in treatment and Medication management. Depression: Will continue Effexor XR 37.5 mg daily. Increased Anxiety: Will increase Buspar to 10 mg  Tid. Insomnia: Will continue Trazodone 150 mg Q bedtime. - Continue 15 minutes observation for safety concerns - Encouraged to participate in milieu therapy and group therapy counseling sessions and also work with coping skills -  Develop treatment plan to decrease risk of relapse upon discharge and to reduce the need for readmission. -  Psycho-social education regarding relapse prevention and self care. - Health care follow up as needed for medical problems. - Restart home medications where appropriate.  Sarah Slates, NP, PMHNP, FNP-BC 11/11/2015, 4:26 PM

## 2015-11-12 NOTE — Progress Notes (Signed)
Pt began conversation by sharing she believes in God, but she is not sure because she questions why she is going through what she is going through. We talked about her religious origin. Her father is reformed [place] and her mother is [employer]. She said she is neither. She described her relationship with her mother as estranged and said it is estranged because she does not believe in the [employer] teachings. She said her mother believes she should be married, with children and staying home. She said her mother does not understand/support her choices. Her mother believes her diagnosis is because of sin. Pt said she and her dad get along better but he does not understand her illness. Pt described how she became hospitalized and how her roommate saved her by bringing her here. Pt said she does well when she can afford her medicine. We talked about where she may be able to get support for her meds from her dad. She said he would do it and plans to ask him to help her in that way. Pt described her dad as being more supportive and of choices and illness even though he does not understand it all. She described her dad as being healthy, active and not on any meds. She said her father is a professor and likes things always to be in order. She said she and her mother may spend brief amounts of time together before they are disagreeing. Pt was very appreciative of visit and said it was helpful. Ch offered listening support, encouragement and answered questions regarding her spiritual concerns. Please page if additional support is needed. Chaplain Marjory LiesPamela Carrington Holder, M.Div.   11/12/15 1700  Clinical Encounter Type  Visited With Patient

## 2015-11-12 NOTE — Progress Notes (Signed)
Writer has observed patient up in the dayroom interacting with select peers. She reports that her day has been good and her goal was working on Pharmacologistcoping skills for her increased anxiety. She reports that she has used distraction as a way to cope with her anxiety today and being out of her room more socializing with peers. She reports that she doesn't feel suicidal but used as an example that if someone came on the unit with a gun she would not move out of the way if it was aimed at her. She denies hi/a/v hallucinations. She reports that her roommate has been a good support. Safety maintained on unit with 15 min checks. Writer praised her for utilizing her coping skills.

## 2015-11-12 NOTE — Progress Notes (Signed)
Adult Psychoeducational Group Note  Date:  11/12/2015 Time:  11:56 PM  Group Topic/Focus:  Wrap-Up Group:   The focus of this group is to help patients review their daily goal of treatment and discuss progress on daily workbooks.   Participation Level:  Active  Participation Quality:  Appropriate  Affect:  Appropriate  Cognitive:  Alert, Appropriate and Oriented  Insight: Appropriate  Engagement in Group:  Engaged  Modes of Intervention:  Discussion  Additional Comments: Patient attended wrap-up group and she said that her day was a 3.  Her goal was to prepare for discharged, but it did occurred,. Her   coping skills are medications and watching tv.  Zeriah Baysinger W Timoty Bourke 11/12/2015, 11:56 PM

## 2015-11-12 NOTE — Progress Notes (Addendum)
D:  Patient's self inventory sheet, patient sleeps good, sleep medication is helpful.  Fair appetite, low energy level, poor concentration.  Rated depression and hopeless 6, anxiety 8.  Denied withdrawals.  Denied SI.  Physical problems, headaches/migraine, worst pain in past 24 hours is #8, no pain medication.  Goal is to get rid of migraine.  Plans to take meds, eat meals.  No discharge plans. A:  Medications administered per MD orders.  Emotional support and encouragement given patient. R:  Denied SI and HI, contracts for safety.  Denied A/V hallucinations.  Safety maintained with 15 minute checks.  UA obtained and taken to lab for pick up.

## 2015-11-12 NOTE — [facility] Group Notes (Signed)
Adult Therapy Group Note  Date: 11/12/2015 Time:  10:00-11:00AM  Group Topic/Focus: Healthy Event organiserupport Systems  Building Self Esteem:   The focus of this group was to assist patients in identifying current healthy supports, as well as how to widen their support systems.  Examples given by various patients were used to emphasize the importance of expanding supports, with an emphasis on the use of AA/NA, problem-specific support groups, doctors, counselors, and self.  Most patients also chose to share the reasons for their current hospitalization, and received much encouragement and support from their fellow patients as they did this.   Participation Level:  Active  Participation Quality:  Attentive and Sharing  Affect:  Appropriate  Cognitive:  Appropriate  Insight: Good  Engagement in Group:  Engaged  Modes of Intervention:  Discussion, Exploration and Support  Additional Comments:  The patient expressed that healthy supports currently active include her roommate, best friend who has been clean from drugs for 5 years, and another friend.  She was not present until the last 15 minutes of group.   Lynnell ChadMareida J Grossman-Orr, LCSW 11/12/2015  4:15PM

## 2015-11-12 NOTE — Plan of Care (Signed)
Problem: Education: Goal: Utilization of techniques to improve thought processes will improve Outcome: Progressing Nurse discussed depression/coping skills with patient.    

## 2015-11-12 NOTE — Progress Notes (Signed)
[facility] MD Progress Note  11/12/2015 2:32 PM Sarah Good  MRN:  [id number]  Subjective: Sarah Good reports, "I am not so great today. I had migraine headache, took some medicine, went & lie down in my bed, missed the first group. Now I'm feeling very guilty, thought about cutting myself? Patient is provided with reassurance & support to participate in the afternoon group.  Objective: 25 year old single female with long history of multiple depressive and anxiety symptoms including suicide ieadtions who is voluntarily admitted here because of escalating symptoms over the past several months.  Current stressors includes starting a new job and a lot of financial stress. She lives with 2 room-mate. Patient wanted to overdose with a friends klonopin.  Principal Problem: Major depressive disorder, recurrent, severe without Psychosis.  Diagnosis:   Patient Active Problem List   Diagnosis Date Noted  . Major depressive disorder, recurrent severe without psychotic features ([place]) [F33.2] 11/09/2015  . Pyelonephritis [N12] 09/20/2015  . Nausea [R11.0] 09/20/2015  . Leukocytosis [D72.829] 09/20/2015  . Hypokalemia [E87.6] 09/20/2015  . Anxiety [F41.9] 09/20/2015  . Nephrolithiasis [N20.0]    Total Time spent with patient: 15 minutes  Past Psychiatric History: Mdd  Past Medical History:  Past Medical History:  Diagnosis Date  . Migraine   . Mitral valve prolapse     Past Surgical History:  Procedure Laterality Date  . WISDOM TOOTH EXTRACTION     Family History:  Family History  Problem Relation Age of Onset  . Adopted: Yes   Family Psychiatric  History: See H&P  Social History:  History  Alcohol Use  . Yes    Comment: socially     History  Drug Use  . Types: Marijuana    Comment: Second-hand    Social History   Social History  . Marital status: Single    Spouse name: N/A  . Number of children: N/A  . Years of education: N/A   Social History Main Topics  . Smoking  status: Never Smoker  . Smokeless tobacco: Never Used  . Alcohol use Yes     Comment: socially  . Drug use:     Types: Marijuana     Comment: Second-hand  . Sexual activity: Not Asked   Other Topics Concern  . None   Social History Narrative  . None   Additional Social History:    Pain Medications: Pt denies Prescriptions: Pt denies Over the Counter: Pt denies History of alcohol / drug use?: No history of alcohol / drug abuse Longest period of sobriety (when/how long): NA  Sleep: Fair  Appetite:  Fair  Current Medications: Current Facility-Administered Medications  Medication Dose Route Frequency Provider Last Rate Last Dose  . acetaminophen (TYLENOL) tablet 650 mg  650 mg Oral Q6H PRN Patrecia Pour, NP   650 mg at 11/11/15 9892  . alum & mag hydroxide-simeth (MAALOX/MYLANTA) 200-200-20 MG/5ML suspension 30 mL  30 mL Oral Q4H PRN Patrecia Pour, NP      . busPIRone (BUSPAR) tablet 10 mg  10 mg Oral TID Encarnacion Slates, NP   10 mg at 11/12/15 1155  . butalbital-acetaminophen-caffeine (FIORICET, ESGIC) 50-325-40 MG per tablet 2 tablet  2 tablet Oral Q6H PRN Patrecia Pour, NP   2 tablet at 11/12/15 0752  . hydrOXYzine (ATARAX/VISTARIL) tablet 25 mg  25 mg Oral Q6H PRN Rozetta Nunnery, NP   25 mg at 11/12/15 1429  . magnesium hydroxide (MILK OF MAGNESIA) suspension 30 mL  30 mL Oral Daily PRN Patrecia Pour, NP      . traZODone (DESYREL) tablet 150 mg  150 mg Oral QHS Sueanne Margarita, MD   150 mg at 11/11/15 2240  . venlafaxine XR (EFFEXOR-XR) 24 hr capsule 37.5 mg  37.5 mg Oral Q breakfast Sueanne Margarita, MD   37.5 mg at 11/12/15 0747   Lab Results:  Results for orders placed or performed during the hospital encounter of 11/09/15 (from the past 48 hour(s))  Comprehensive metabolic panel     Status: Abnormal   Collection Time: 11/10/15  6:17 PM  Result Value Ref Range   Sodium 140 135 - 145 mmol/L   Potassium 3.7 3.5 - 5.1 mmol/L   Chloride 107 101 - 111 mmol/L   CO2 28 22 -  32 mmol/L   Glucose, Bld 95 65 - 99 mg/dL   BUN 8 6 - 20 mg/dL   Creatinine, Ser 0.86 0.44 - 1.00 mg/dL   Calcium 8.9 8.9 - 10.3 mg/dL   Total Protein 6.9 6.5 - 8.1 g/dL   Albumin 4.1 3.5 - 5.0 g/dL   AST 16 15 - 41 U/L   ALT 13 (L) 14 - 54 U/L   Alkaline Phosphatase 56 38 - 126 U/L   Total Bilirubin 0.5 0.3 - 1.2 mg/dL   GFR calc non Af Amer >60 >60 mL/min   GFR calc Af Amer >60 >60 mL/min    Comment: (NOTE) The eGFR has been calculated using the CKD EPI equation. This calculation has not been validated in all clinical situations. eGFR's persistently <60 mL/min signify possible Chronic Kidney Disease.    Anion gap 5 5 - 15    Comment: Performed at [facility]  CBC with Differential/Platelet     Status: None   Collection Time: 11/10/15  6:17 PM  Result Value Ref Range   WBC 7.1 4.0 - 10.5 K/uL   RBC 4.56 3.87 - 5.11 MIL/uL   Hemoglobin 13.9 12.0 - 15.0 g/dL   HCT 40.8 36.0 - 46.0 %   MCV 89.5 78.0 - 100.0 fL   MCH 30.5 26.0 - 34.0 pg   MCHC 34.1 30.0 - 36.0 g/dL   RDW 12.5 11.5 - 15.5 %   Platelets 344 150 - 400 K/uL   Neutrophils Relative % 42 %   Neutro Abs 3.0 1.7 - 7.7 K/uL   Lymphocytes Relative 43 %   Lymphs Abs 3.0 0.7 - 4.0 K/uL   Monocytes Relative 9 %   Monocytes Absolute 0.7 0.1 - 1.0 K/uL   Eosinophils Relative 5 %   Eosinophils Absolute 0.4 0.0 - 0.7 K/uL   Basophils Relative 1 %   Basophils Absolute 0.1 0.0 - 0.1 K/uL    Comment: Performed at [facility]   Blood Alcohol level:  No results found for: [facility]  Metabolic Disorder Labs: No results found for: HGBA1C, MPG No results found for: PROLACTIN No results found for: CHOL, TRIG, HDL, CHOLHDL, VLDL, LDLCALC  Physical Findings: AIMS: Facial and Oral Movements Muscles of Facial Expression: None, normal Lips and Perioral Area: None, normal Jaw: None, normal Tongue: None, normal,Extremity Movements Upper (arms, wrists, hands, fingers): None, normal Lower (legs,  knees, ankles, toes): None, normal, Trunk Movements Neck, shoulders, hips: None, normal, Overall Severity Severity of abnormal movements (highest score from questions above): None, normal Incapacitation due to abnormal movements: None, normal Patient's awareness of abnormal movements (rate only patient's report): No Awareness, Dental Status Current problems  with teeth and/or dentures?: No Does patient usually wear dentures?: No  CIWA:  CIWA-Ar Total: 1 COWS:  COWS Total Score: 1  Musculoskeletal: Strength & Muscle Tone: within normal limits Gait & Station: normal Patient leans: N/A  Psychiatric Specialty Exam: Physical Exam  ROS  Blood pressure 102/64, pulse 80, temperature 97.3 F (36.3 C), resp. rate 18, height 5' 4"  (1.626 m), weight 72.6 kg (160 lb), SpO2 100 %.Body mass index is 27.46 kg/m.  General Appearance: Casual  Eye Contact:  Good  Speech:  Clear and Coherent and Normal Rate  Volume:  Normal  Mood:  Anxious, Depressed and Dysphoric  Affect:  Depressed  Thought Process:  Coherent and Goal Directed  Orientation:  Full (Time, Place, and Person)  Thought Content:  Logical  Suicidal Thoughts:  Yes.  with intent/plan  Homicidal Thoughts:  No  Memory:  Immediate;   Fair Recent;   Fair Remote;   Fair  Judgement:  Fair  Insight:  Fair  Psychomotor Activity:  Normal  Concentration:  Concentration: Fair and Attention Span: Fair  Recall:  [employer] of Knowledge:  Fair  Language:  Fair  Akathisia:  No  Handed:  Right  AIMS (if indicated):     Assets:  Communication Skills Desire for Improvement Financial Resources/Insurance Housing Physical Health Social Support Transportation  ADL's:  Intact  Cognition:  WNL  Sleep:  Number of Hours: 6.5     Treatment Plan Summary: Daily contact with patient to assess and evaluate symptoms and progress in treatment and Medication management. Depression: Will continue Effexor XR 37.5 mg daily. Increased Anxiety: Will  increase Buspar to 10 mg  Tid. Insomnia: Will continue Trazodone 150 mg Q bedtime. - Continue 15 minutes observation for safety concerns - Encouraged to participate in milieu therapy and group therapy counseling sessions and also work with coping skills -  Develop treatment plan to decrease risk of relapse upon discharge and to reduce the need for readmission. -  Psycho-social education regarding relapse prevention and self care. - Health care follow up as needed for medical problems. - Restart home medications where appropriate. No changes made, continue current plan of care.  Encarnacion Slates, NP, PMHNP, FNP-BC 11/12/2015, 2:32 PMPatient ID: Sarah Good, female   DOB: 15-Jun-1990, 25 y.o.   MRN: [id number]

## 2015-11-13 LAB — URINE CULTURE

## 2015-11-13 MED ORDER — SERTRALINE HCL 50 MG PO TABS
50.0000 mg | ORAL_TABLET | Freq: Every day | ORAL | Status: DC
  Administered 2015-11-13 – 2015-11-15 (×3): 50 mg via ORAL
  Filled 2015-11-13 (×6): qty 1

## 2015-11-13 MED ORDER — BUSPIRONE HCL 15 MG PO TABS
15.0000 mg | ORAL_TABLET | Freq: Three times a day (TID) | ORAL | Status: DC
  Administered 2015-11-13 – 2015-11-16 (×10): 15 mg via ORAL
  Filled 2015-11-13 (×2): qty 1
  Filled 2015-11-13 (×2): qty 5
  Filled 2015-11-13 (×9): qty 1
  Filled 2015-11-13: qty 5

## 2015-11-13 NOTE — Progress Notes (Signed)
Recreation Therapy Notes  Date: 11/13/15  Time: 0930 Location: [address] Dayroom  Group Topic: Stress Management  Goal Area(s) Addresses:  Patient will verbalize importance of using healthy stress management.  Patient will identify positive emotions associated with healthy stress management.   Intervention: Guided Imagery  Activity :  Engineer, technical saleseaceful Place Imagery.  LRT introduced to the technique of guided imagery to patients.  LRT read a script so patients could participate in the technique.  Patients were to follow along as LRT read script.  Education:  Stress Management, Discharge Planning.   Education Outcome: Acknowledges edcuation/In group clarification offered/Needs additional education  Clinical Observations/Feedback: Pt did not attend group.      Caroll RancherMarjette Jesi Jurgens, LRT/CTRS         Caroll RancherLindsay, Katrina Daddona A 11/13/2015 12:03 PM

## 2015-11-13 NOTE — [facility] Group Notes (Signed)
Adult Psychoeducational Group Note  Date:  11/13/2015 Time:  1:30 PM  Overcoming Obstacles  Participation Level:  Active  Participation Quality:  Appropriate  Affect:  Appropriate  Cognitive:  Appropriate  Insight: Improving  Engagement in Group:  Developing/Improving  Modes of Intervention:  Discussion, Exploration and Support  Additional Comments: Today's Topic: Overcoming Obstacles. Patients identified one short term goal and potential obstacles in reaching this goal. Patients processed barriers involved in overcoming these obstacles. Patients identified steps necessary for overcoming these obstacles and explored motivation (internal and external) for facing these difficulties head on. 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   Sarah Good 11/13/2015, 5:44 PM

## 2015-11-13 NOTE — Plan of Care (Signed)
Problem: Coping: Goal: Ability to cope will improve Outcome: Progressing Nurse discussed depression/anxiety/coping skills with patient.    

## 2015-11-13 NOTE — Progress Notes (Addendum)
[facility] MD Progress Note  11/13/2015 6:16 PM Sarah Good  MRN:  [id number]  Subjective: Patient reports that she is still feeling depressed, sad, but improved compared to how she felt prior to admission. At this time denies active suicidal ideations .   Objective:  I have discussed case with treatment team and have met with patient . She is a 25 year old single female, lives with roommate, employed. She reports worsening depression, and recent SI , with thoughts of overdosing . She reports significant anxiety, tendency to worry excessively, in addition to depression . At this time reports still feeling depressed, but feels somewhat better- currently denies suicidal ideations and is able to contract for safety on unit . We discussed medications- she states she has been on Effexor XR in the past, but is concerned because it caused severe libido loss, which in turn negatively affected her relationship with SO. She prefers to try another medication at this time. We reviewed Wellbutrin as an option not likely to be associated with sexual dysfunction, but because anxiety is a major symptom, we opted for SSRI. She has been visible on unit, going to groups, no disruptive behaviors    Principal Problem: Major depressive disorder, recurrent, severe without Psychosis.  Diagnosis:   Patient Active Problem List   Diagnosis Date Noted  . Major depressive disorder, recurrent severe without psychotic features ([place]) [F33.2] 11/09/2015  . Pyelonephritis [N12] 09/20/2015  . Nausea [R11.0] 09/20/2015  . Leukocytosis [D72.829] 09/20/2015  . Hypokalemia [E87.6] 09/20/2015  . Anxiety [F41.9] 09/20/2015  . Nephrolithiasis [N20.0]    Total Time spent with patient:  20 minutes   Past Psychiatric History: MDD   Past Medical History:  Past Medical History:  Diagnosis Date  . Migraine   . Mitral valve prolapse     Past Surgical History:  Procedure Laterality Date  . WISDOM TOOTH EXTRACTION      Family History:  Family History  Problem Relation Age of Onset  . Adopted: Yes   Family Psychiatric  History: See H&P  Social History:  History  Alcohol Use  . Yes    Comment: socially     History  Drug Use  . Types: Marijuana    Comment: Second-hand    Social History   Social History  . Marital status: Single    Spouse name: N/A  . Number of children: N/A  . Years of education: N/A   Social History Main Topics  . Smoking status: Never Smoker  . Smokeless tobacco: Never Used  . Alcohol use Yes     Comment: socially  . Drug use:     Types: Marijuana     Comment: Second-hand  . Sexual activity: Not Asked   Other Topics Concern  . None   Social History Narrative  . None   Additional Social History:    Pain Medications: Pt denies Prescriptions: Pt denies Over the Counter: Pt denies History of alcohol / drug use?: No history of alcohol / drug abuse Longest period of sobriety (when/how long): NA  Sleep: improving   Appetite:  Fair  Current Medications: Current Facility-Administered Medications  Medication Dose Route Frequency Provider Last Rate Last Dose  . acetaminophen (TYLENOL) tablet 650 mg  650 mg Oral Q6H PRN Patrecia Pour, NP   650 mg at 11/11/15 0093  . alum & mag hydroxide-simeth (MAALOX/MYLANTA) 200-200-20 MG/5ML suspension 30 mL  30 mL Oral Q4H PRN Patrecia Pour, NP      . busPIRone (  BUSPAR) tablet 15 mg  15 mg Oral TID Jenne Campus, MD   15 mg at 11/13/15 1622  . butalbital-acetaminophen-caffeine (FIORICET, ESGIC) 50-325-40 MG per tablet 2 tablet  2 tablet Oral Q6H PRN Patrecia Pour, NP   2 tablet at 11/12/15 0752  . hydrOXYzine (ATARAX/VISTARIL) tablet 25 mg  25 mg Oral Q6H PRN Rozetta Nunnery, NP   25 mg at 11/12/15 2124  . magnesium hydroxide (MILK OF MAGNESIA) suspension 30 mL  30 mL Oral Daily PRN Patrecia Pour, NP      . sertraline (ZOLOFT) tablet 50 mg  50 mg Oral Daily Jenne Campus, MD   50 mg at 11/13/15 1622  . traZODone  (DESYREL) tablet 150 mg  150 mg Oral QHS Sueanne Margarita, MD   150 mg at 11/12/15 2124   Lab Results:  No results found for this or any previous visit (from the past 48 hour(s)). Blood Alcohol level:  No results found for: [facility]  Metabolic Disorder Labs: No results found for: HGBA1C, MPG No results found for: PROLACTIN No results found for: CHOL, TRIG, HDL, CHOLHDL, VLDL, LDLCALC  Physical Findings: AIMS: Facial and Oral Movements Muscles of Facial Expression: None, normal Lips and Perioral Area: None, normal Jaw: None, normal Tongue: None, normal,Extremity Movements Upper (arms, wrists, hands, fingers): None, normal Lower (legs, knees, ankles, toes): None, normal, Trunk Movements Neck, shoulders, hips: None, normal, Overall Severity Severity of abnormal movements (highest score from questions above): None, normal Incapacitation due to abnormal movements: None, normal Patient's awareness of abnormal movements (rate only patient's report): No Awareness, Dental Status Current problems with teeth and/or dentures?: No Does patient usually wear dentures?: No  CIWA:  CIWA-Ar Total: 0 COWS:  COWS Total Score: 1  Musculoskeletal: Strength & Muscle Tone: within normal limits Gait & Station: normal Patient leans: N/A  Psychiatric Specialty Exam: Physical Exam  ROS denies nausea or vomiting   Blood pressure 118/62, pulse 84, temperature 98.2 F (36.8 C), temperature source Oral, resp. rate 18, height 5' 4"  (1.626 m), weight 160 lb (72.6 kg), SpO2 100 %.Body mass index is 27.46 kg/m.  General Appearance: well groomed   Eye Contact:  Good  Speech:  Normal Rate  Volume:  Normal  Mood:   Depressed - reports some improvement   Affect:  constricted, anxious, but reactive   Thought Process:   Goal Directed  Orientation:  Full (Time, Place, and Person)  Thought Content:  no hallucinations, no delusions   Suicidal Thoughts: At this time denies plan or intention of suicide or self injurious  ideations, contracts for safety on unit   Homicidal Thoughts:  No  Memory:  recent and remote grossly intact   Judgement:  Improving   Insight:  improving   Psychomotor Activity:  Normal  Concentration:  Concentration: good and Attention Span: good   Recall:  good   Fund of Knowledge:  good  Language: good   Akathisia:  No  Handed:  Right  AIMS (if indicated):     Assets:  Communication Skills Desire for Improvement Financial Resources/Insurance Housing Physical Health Social Support Transportation  ADL's:  Intact  Cognition:  WNL  Sleep:  Number of Hours: 6.5      Assessment - patient reports ongoing depression, sadness,anxiety, but feels she has made some improvement . At this time not suicidal and able to contract for safety. Although she is tolerating Effexor XR trial well at this time, reports feeling hesitant to continue this trial  due to history of sexual dysfunction on it. We discussed other options, and she agreed to Zoloft trial.  Treatment Plan Summary: Daily contact with patient to assess and evaluate symptoms and progress in treatment and Medication management. Encourage ongoing group and milieu participation to work on coping skills and symptom reduction Depression: Discontinue  Effexor XR due to side effect concerns as above. Start Zoloft 50 mgrs QDAY - side effects reviewed to include potential for sexual side effects  Increased Anxiety: Will increase Buspar to 87m  TID. Insomnia: Will continue Trazodone 150 mg QHS Treatment team working on disposition planning options  Will check TSH- patient also reports a history of VItamin D deficiency, and requests that this be rechecked .  CNeita Garnet MD 11/13/2015, 6:16 PM  Patient ID: RMargit Hanks female   DOB: [birth year] 25y.o.   MRN: [id number]

## 2015-11-13 NOTE — Progress Notes (Signed)
Writer observed patient sitting in the dayroom watching tv. Writer spoke with her 1:1 and she reports that her day had been rough due to a migraine. She reports that the migraine caused her to have increased anxiety. She had received medication for relief and it had decreased to a 3 and she reported that it was not worth her taking tylenol for it. She reports that she came top the dayroom after resting to make sure she was up and not isolating in her room to help cope with her anxiety. Support given and safety maintained on unit with 15 min checks.

## 2015-11-13 NOTE — Progress Notes (Signed)
D: Sarah Good "Sarah Good" reports persistant anxiety 8/10 with no specific triggers. She states she "had a pretty good day". She is more conversant tonight compared to previous nights. Mood seems brighter.  Denies SI/HI/AVH at this time. Contracts for safety.   A: Encouragement and support given. Q15 minute room checks for patient safety. Medications administered as prescribed.   R: Continue to monitor for patient safety and medication effectiveness.

## 2015-11-13 NOTE — Progress Notes (Signed)
Pt appears to have had a good day and attended all groups. Pt stated that yesterday she'd felt suicidal but once she talked to the MD, that she felt better about herself and her situation. Stated that she struggles with feelings of worthlessness but practices theraputic self talk such as, "I have helped a lot of people." and other positive self statements. Pt was sleeping in the chair in the day room on and off but also socialized with peers throughout the day. Compliant with all medications. Had no c/o anxiety and had no requests for PRN's. Contracted for safety and denied AH/VH/SI.

## 2015-11-14 LAB — TSH: TSH: 1.726 u[IU]/mL (ref 0.350–4.500)

## 2015-11-14 MED ORDER — HYDROXYZINE HCL 50 MG PO TABS
50.0000 mg | ORAL_TABLET | Freq: Three times a day (TID) | ORAL | Status: DC | PRN
  Administered 2015-11-14 – 2015-11-15 (×2): 50 mg via ORAL
  Filled 2015-11-14 (×2): qty 1

## 2015-11-14 MED ORDER — BUTALBITAL-APAP-CAFFEINE 50-325-40 MG PO TABS
1.0000 | ORAL_TABLET | Freq: Once | ORAL | Status: AC
  Administered 2015-11-14: 1 via ORAL
  Filled 2015-11-14: qty 1

## 2015-11-14 NOTE — Progress Notes (Addendum)
[facility] MD Progress Note  11/14/2015 11:20 AM Sarah Good  MRN:  [id number]  Subjective: Patient reports she has made some progress, had been feeling better, but today is not feeling well, which she attributes to migraine headache.     Objective:  I have discussed case with treatment team and have met with patient . Patient in bed, room lights off, states she is having a migraine headache. She has history of migraines, and states she has had frequent headaches even prior to admission. As such, it is not clear that current headache is a side effect from Zoloft ( new antidepressant trial she started yesterday) . She also endorse ongoing anxiety symptoms.As per  chart notes, she had been presenting with improved mood and range of affect.  She has been visible on unit, going to groups- although states she has not gone this morning because of her headache, presents calm and has not presented with any disruptive behaviors . TSH WNL.   Principal Problem: Major depressive disorder, recurrent, severe without Psychosis.  Diagnosis:   Patient Active Problem List   Diagnosis Date Noted  . Major depressive disorder, recurrent severe without psychotic features ([place]) [F33.2] 11/09/2015  . Pyelonephritis [N12] 09/20/2015  . Nausea [R11.0] 09/20/2015  . Leukocytosis [D72.829] 09/20/2015  . Hypokalemia [E87.6] 09/20/2015  . Anxiety [F41.9] 09/20/2015  . Nephrolithiasis [N20.0]    Total Time spent with patient:  20 minutes   Past Psychiatric History: MDD   Past Medical History:  Past Medical History:  Diagnosis Date  . Migraine   . Mitral valve prolapse     Past Surgical History:  Procedure Laterality Date  . WISDOM TOOTH EXTRACTION     Family History:  Family History  Problem Relation Age of Onset  . Adopted: Yes   Family Psychiatric  History: See H&P  Social History:  History  Alcohol Use  . Yes    Comment: socially     History  Drug Use  . Types: Marijuana    Comment:  Second-hand    Social History   Social History  . Marital status: Single    Spouse name: N/A  . Number of children: N/A  . Years of education: N/A   Social History Main Topics  . Smoking status: Never Smoker  . Smokeless tobacco: Never Used  . Alcohol use Yes     Comment: socially  . Drug use:     Types: Marijuana     Comment: Second-hand  . Sexual activity: Not Asked   Other Topics Concern  . None   Social History Narrative  . None   Additional Social History:    Pain Medications: Pt denies Prescriptions: Pt denies Over the Counter: Pt denies History of alcohol / drug use?: No history of alcohol / drug abuse Longest period of sobriety (when/how long): NA  Sleep: improving   Appetite:  Fair  Current Medications: Current Facility-Administered Medications  Medication Dose Route Frequency Provider Last Rate Last Dose  . acetaminophen (TYLENOL) tablet 650 mg  650 mg Oral Q6H PRN Patrecia Pour, NP   650 mg at 11/11/15 1093  . alum & mag hydroxide-simeth (MAALOX/MYLANTA) 200-200-20 MG/5ML suspension 30 mL  30 mL Oral Q4H PRN Patrecia Pour, NP      . busPIRone (BUSPAR) tablet 15 mg  15 mg Oral TID Jenne Campus, MD   15 mg at 11/14/15 0801  . butalbital-acetaminophen-caffeine (FIORICET, ESGIC) 50-325-40 MG per tablet 2 tablet  2 tablet Oral Q6H  PRN Patrecia Pour, NP   1 tablet at 11/14/15 [phone]  . hydrOXYzine (ATARAX/VISTARIL) tablet 25 mg  25 mg Oral Q6H PRN Rozetta Nunnery, NP   25 mg at 11/13/15 2056  . magnesium hydroxide (MILK OF MAGNESIA) suspension 30 mL  30 mL Oral Daily PRN Patrecia Pour, NP      . sertraline (ZOLOFT) tablet 50 mg  50 mg Oral Daily Jenne Campus, MD   50 mg at 11/14/15 0801  . traZODone (DESYREL) tablet 150 mg  150 mg Oral QHS Sueanne Margarita, MD   150 mg at 11/13/15 2241   Lab Results:  Results for orders placed or performed during the hospital encounter of 11/09/15 (from the past 48 hour(s))  TSH     Status: None   Collection Time:  11/14/15  6:26 AM  Result Value Ref Range   TSH 1.726 0.350 - 4.500 uIU/mL    Comment: Performed at [facility]   Blood Alcohol level:  No results found for: [facility]  Metabolic Disorder Labs: No results found for: HGBA1C, MPG No results found for: PROLACTIN No results found for: CHOL, TRIG, HDL, CHOLHDL, VLDL, LDLCALC  Physical Findings: AIMS: Facial and Oral Movements Muscles of Facial Expression: None, normal Lips and Perioral Area: None, normal Jaw: None, normal Tongue: None, normal,Extremity Movements Upper (arms, wrists, hands, fingers): None, normal Lower (legs, knees, ankles, toes): None, normal, Trunk Movements Neck, shoulders, hips: None, normal, Overall Severity Severity of abnormal movements (highest score from questions above): None, normal Incapacitation due to abnormal movements: None, normal Patient's awareness of abnormal movements (rate only patient's report): No Awareness, Dental Status Current problems with teeth and/or dentures?: No Does patient usually wear dentures?: No  CIWA:  CIWA-Ar Total: 4 COWS:  COWS Total Score: 1  Musculoskeletal: Strength & Muscle Tone: within normal limits Gait & Station: normal Patient leans: N/A  Psychiatric Specialty Exam: Physical Exam  ROS (+) migrainous headache today , denies vomiting   Blood pressure (!) 105/51, pulse (!) 101, temperature 97.5 F (36.4 C), temperature source Oral, resp. rate 18, height 5' 4"  (1.626 m), weight 160 lb (72.6 kg), SpO2 100 %.Body mass index is 27.46 kg/m.  General Appearance: well groomed   Eye Contact:   fair   Speech:  Normal Rate  Volume:  Normal  Mood:  gradually improving mood   Affect:  today still somewhat restricted, reports feeling anxious   Thought Process:   Goal Directed  Orientation:  Full (Time, Place, and Person)  Thought Content:  no hallucinations, no delusions   Suicidal Thoughts: At this time denies plan or intention of suicide or self injurious  ideations, contracts for safety on unit   Homicidal Thoughts:  No  Memory:  recent and remote grossly intact   Judgement:  Improving   Insight:  improving   Psychomotor Activity:  Normal  Concentration:  Concentration: good and Attention Span: good   Recall:  good   Fund of Knowledge:  good  Language: good   Akathisia:  No  Handed:  Right  AIMS (if indicated):     Assets:  Communication Skills Desire for Improvement Financial Resources/Insurance Housing Physical Health Social Support Transportation  ADL's:  Intact  Cognition:  WNL  Sleep:  Number of Hours: 6.5      Assessment - patient reports improving mood overall, some persistent sense of anxiety, apprehension, denies any suicidal ideations. As per chart notes, she has been presenting with improving affect. Today  reports having a migraine headache- has history of migraines. Because of headache, states she has spent most of the morning in bed. Denies any suicidal ideations . Thus far tolerating Zoloft trial and Buspar titration well .  Treatment Plan Summary: Daily contact with patient to assess and evaluate symptoms and progress in treatment and Medication management. Encourage ongoing group and milieu participation to work on Radiographer, therapeutic and symptom reduction Depression: Continue Zoloft 50 mgrs QDAY  Increased Anxiety: Continue  Buspar  43m  TID. Insomnia: Continue  Trazodone 150 mg QHS Headache: Continue Fioricet 1-2 tablets Q 6 hours PRN- patient states she tolerates this medication well and finds it helps  Treatment team working on disposition planning options   CNeita Garnet MD 11/14/2015, 11:20 AM  Patient ID: RMargit Good female   DOB: [birth year] 25y.o.   MRN: [id number]

## 2015-11-14 NOTE — Progress Notes (Signed)
Pt requesting assistance from nurse. Found sitting in hall with her hands covering her face. Verbalized that she was having an 'anxiety attack'. Pt stated that she did not know what triggered it. Advised to breath slow and calmly which she did. Pt was given PRN Hydroxyzine 25 mg po at 1447. Pt was reassured by nursing staff and provided emotional comfort and support. Pt was re-directed from her symptoms by staff nurse with talk of her pets at home and [facility]. Pt went to the day room ambulating without difficulty and without further incident. At 1530 pt in day room still having some anxiety but verbalized medication provided her with some relief.

## 2015-11-14 NOTE — Progress Notes (Signed)
Pt presented to med window for am meds pleasant and engaging. When asked, pt stated that she was doing well with coping strategies and verbalized that she is doing 'much better'. Pt med compliant and states that she knows that she needs to keep taking her medications because she does well on them. Pt did verbalized a 3/10 headache and feared that she was getting a migraine as she does get them from time to time. She was given a PRN of Fioricet at [id number] and stated that she was going to lay down beffore her migraine got worse. At 0900 pt was in her room asleep.

## 2015-11-14 NOTE — Progress Notes (Signed)
D: Pt denies SI/HI/AVH. Pt is pleasant and cooperative. Pt stated she was feeling better this evening, pt was seen not to be in any distress and did not appear anxious.   A: Pt was offered support and encouragement. Pt was given scheduled medications. Pt was encourage to attend groups. Q 15 minute checks were done for safety.   R:Pt attends groups and interacts well with peers and staff. Pt is taking medication. Pt has no complaints.Pt receptive to treatment and safety maintained on unit.

## 2015-11-14 NOTE — Progress Notes (Signed)
Pt requested to have antianxiety med increased d/t anxiety and panic attacks today. Dr. Jama Flavorsobos made aware. Verbal order obtained to modify Vistaril order. Vistaril increased to 50 mg every eight hours as needed for anxiety per MD.

## 2015-11-14 NOTE — Progress Notes (Signed)
Recreation Therapy Notes  Animal-Assisted Activity (AAA) Program Checklist/Progress Notes Patient Eligibility Criteria Checklist & Daily Group note for Rec TxIntervention  Date: 10.03.2017 Time: 2:45pm Location: [address] Dayroom    AAA/T Program Assumption of Risk Form signed by Patient/ or Parent Legal Guardian Yes  Patient is free of allergies or sever asthma Yes  Patient reports no fear of animals Yes  Patient reports no history of cruelty to animals Yes  Patient understands his/her participation is voluntary Yes  Patient washes hands before animal contact Yes  Patient washes hands after animal contact Yes  Behavioral Response: Engaged, Attentive   Education:Hand Washing, Appropriate Animal Interaction   Education Outcome: Acknowledges education.   Clinical Observations/Feedback: Patient attended session and interacted appropriately with therapy dog and peers. .   Sarah Good, LRT/CTRS  Sarah Good 11/14/2015 3:07 PM 

## 2015-11-14 NOTE — [facility] Group Notes (Signed)
[facility] LCSW Group Therapy 11/14/2015 1:15 PM  Type of Therapy: Group Therapy- Feelings about Diagnosis  Participation Level: Active   Participation Quality:  Appropriate  Affect:  Appropriate  Cognitive: Alert and Oriented   Insight:  Developing   Engagement in Therapy: Developing/Improving and Engaged   Modes of Intervention: Clarification, Confrontation, Discussion, Education, Exploration, Limit-setting, Orientation, Problem-solving, Rapport Building, Dance movement psychotherapisteality Testing, Socialization and Support  Description of Group:   This group will allow patients to explore their thoughts and feelings about diagnoses they have received. Patients will be guided to explore their level of understanding and acceptance of these diagnoses. Facilitator will encourage patients to process their thoughts and feelings about the reactions of others to their diagnosis, and will guide patients in identifying ways to discuss their diagnosis with significant others in their lives. This group will be process-oriented, with patients participating in exploration of their own experiences as well as giving and receiving support and challenge from other group members.  Summary of Progress/Problems:  Pt was able to offer suggestions to the group regarding ways to combat negative thinking. Pt discussed "personalization" of her depression/eating disorder/anxiety as a helpful way to separate it from herself and recognize negative thoughts. Patient openly discussion her diagnosis of Borderline Personality Disorder and how she feels that her family is not supportive of that diagnosis.   Therapeutic Modalities:   Cognitive Behavioral Therapy Solution Focused Therapy Motivational Interviewing Relapse Prevention Therapy  Chad CordialLauren Carter, LCSWA 11/14/2015 4:28 PM

## 2015-11-15 LAB — VITAMIN D 25 HYDROXY (VIT D DEFICIENCY, FRACTURES): VIT D 25 HYDROXY: 19.6 ng/mL — AB (ref 30.0–100.0)

## 2015-11-15 MED ORDER — VITAMIN D3 25 MCG (1000 UNIT) PO TABS
1000.0000 [IU] | ORAL_TABLET | Freq: Every day | ORAL | Status: DC
  Administered 2015-11-15 – 2015-11-16 (×2): 1000 [IU] via ORAL
  Filled 2015-11-15 (×5): qty 1

## 2015-11-15 MED ORDER — SERTRALINE HCL 100 MG PO TABS
100.0000 mg | ORAL_TABLET | Freq: Every day | ORAL | Status: DC
  Administered 2015-11-16: 100 mg via ORAL
  Filled 2015-11-15: qty 1
  Filled 2015-11-15: qty 2
  Filled 2015-11-15: qty 1

## 2015-11-15 NOTE — [facility] Group Notes (Signed)
[facility] LCSW Group Therapy 11/15/2015 1:15 PM  Type of Therapy: Group Therapy- Emotion Regulation  Participation Level: Active   Participation Quality:  Appropriate  Affect: Appropriate  Cognitive: Alert and Oriented   Insight:  Developing/Improving  Engagement in Therapy: Developing/Improving and Engaged   Modes of Intervention: Clarification, Confrontation, Discussion, Education, Exploration, Limit-setting, Orientation, Problem-solving, Rapport Building, Dance movement psychotherapisteality Testing, Socialization and Support  Summary of Progress/Problems: The topic for group today was emotional regulation. This group focused on both positive and negative emotion identification and allowed group members to process ways to identify feelings, regulate negative emotions, and find healthy ways to manage internal/external emotions. Group members were asked to reflect on a time when their reaction to an emotion led to a negative outcome and explored how alternative responses using emotion regulation would have benefited them. Group members were also asked to discuss a time when emotion regulation was utilized when a negative emotion was experienced. Pt expressed how she feels that her anxiety controls her at times. Pt describes a desire to feel anger as she feels that it is easier for her to manage in comparison to feeling sad.    Chad CordialLauren Carter, LCSWA 11/15/2015 4:07 PM

## 2015-11-15 NOTE — Progress Notes (Signed)
Adult Psychoeducational Group Note  Date:  11/15/2015 Time:  11:42 PM  Group Topic/Focus:  Wrap-Up Group:   The focus of this group is to help patients review their daily goal of treatment and discuss progress on daily workbooks.   Participation Level:  Active  Participation Quality:  Appropriate  Affect:  Appropriate  Cognitive:  Alert, Appropriate and Oriented  Insight: Appropriate  Engagement in Group:  Engaged  Modes of Intervention:  Discussion  Additional Comments:  Patient attended group and said that her day was a 7.  Her goal for today was to discuss  Her discharge plan and she did.  Kalyani Maeda W Gaylen Pereira 11/15/2015, 11:42 PM

## 2015-11-15 NOTE — Progress Notes (Signed)
Recreation Therapy Notes  Date: 11/15/15 Time: 0930 Location: [address] Hall Group Room  Group Topic: Stress Management  Goal Area(s) Addresses:  Patient will verbalize importance of using healthy stress management.  Patient will identify positive emotions associated with healthy stress management.   Intervention: Stress Management  Activity :  Progressive Muscle Relaxation.  LRT introduced the technique of progressive muscle relaxation to the group.  LRT read script to engage patients in the technique.  Patients were to follow along as LRT read a script to participate in activity.  Education:  Stress Management, Discharge Planning.   Education Outcome: Acknowledges edcuation/In group clarification offered/Needs additional education  Clinical Observations/Feedback: Pt did not attend group.     Caroll RancherMarjette Allyse Fregeau, LRT/CTRS      Caroll RancherLindsay, Aubrie Lucien A 11/15/2015 11:46 AM

## 2015-11-15 NOTE — Progress Notes (Signed)
[facility] MD Progress Note  11/15/2015 4:44 PM Sarah Good  MRN:  [id number]  Subjective: Patient reports she is feeling much better than on admission and at this time she is focusing on being discharged soon.  At this time denies medication side effects, has tolerated Zoloft and Buspar well, without side effects. At present minimizes severe depression or significant neuro-vegetative symptoms. Denies headache at this time.    Objective:  I have discussed case with treatment team and have met with patient . Patient presents with improving mood and improved range of affect. Denies suicidal ideations and is future oriented .  At this time denies medication side effects- thus far tolerating medications well . No disruptive or agitated behaviors on unit, going to groups.  Yesterday had been experiencing migraine ( has a history of frequent headaches ) . Today improved and denies headache at this time. Of note, patient reports she has a history of Vitamin D deficiency and her current Vit D serum level is low.   Principal Problem: Major depressive disorder, recurrent, severe without Psychosis.  Diagnosis:   Patient Active Problem List   Diagnosis Date Noted  . Major depressive disorder, recurrent severe without psychotic features ([place]) [F33.2] 11/09/2015  . Pyelonephritis [N12] 09/20/2015  . Nausea [R11.0] 09/20/2015  . Leukocytosis [D72.829] 09/20/2015  . Hypokalemia [E87.6] 09/20/2015  . Anxiety [F41.9] 09/20/2015  . Nephrolithiasis [N20.0]    Total Time spent with patient:  20 minutes   Past Psychiatric History: MDD   Past Medical History:  Past Medical History:  Diagnosis Date  . Migraine   . Mitral valve prolapse     Past Surgical History:  Procedure Laterality Date  . WISDOM TOOTH EXTRACTION     Family History:  Family History  Problem Relation Age of Onset  . Adopted: Yes   Family Psychiatric  History: See H&P  Social History:  History  Alcohol Use  . Yes    Comment: socially     History  Drug Use  . Types: Marijuana    Comment: Second-hand    Social History   Social History  . Marital status: Single    Spouse name: N/A  . Number of children: N/A  . Years of education: N/A   Social History Main Topics  . Smoking status: Never Smoker  . Smokeless tobacco: Never Used  . Alcohol use Yes     Comment: socially  . Drug use:     Types: Marijuana     Comment: Second-hand  . Sexual activity: Not Asked   Other Topics Concern  . None   Social History Narrative  . None   Additional Social History:    Pain Medications: Pt denies Prescriptions: Pt denies Over the Counter: Pt denies History of alcohol / drug use?: No history of alcohol / drug abuse Longest period of sobriety (when/how long): NA  Sleep: improving   Appetite:  Improving   Current Medications: Current Facility-Administered Medications  Medication Dose Route Frequency Provider Last Rate Last Dose  . acetaminophen (TYLENOL) tablet 650 mg  650 mg Oral Q6H PRN Patrecia Pour, NP   650 mg at 11/15/15 0806  . alum & mag hydroxide-simeth (MAALOX/MYLANTA) 200-200-20 MG/5ML suspension 30 mL  30 mL Oral Q4H PRN Patrecia Pour, NP      . busPIRone (BUSPAR) tablet 15 mg  15 mg Oral TID Jenne Campus, MD   15 mg at 11/15/15 1256  . butalbital-acetaminophen-caffeine (FIORICET, ESGIC) 50-325-40 MG per tablet 2  tablet  2 tablet Oral Q6H PRN Patrecia Pour, NP   1 tablet at 11/14/15 0804  . hydrOXYzine (ATARAX/VISTARIL) tablet 50 mg  50 mg Oral Q8H PRN Jenne Campus, MD   50 mg at 11/14/15 2223  . magnesium hydroxide (MILK OF MAGNESIA) suspension 30 mL  30 mL Oral Daily PRN Patrecia Pour, NP      . sertraline (ZOLOFT) tablet 50 mg  50 mg Oral Daily Jenne Campus, MD   50 mg at 11/15/15 0805  . traZODone (DESYREL) tablet 150 mg  150 mg Oral QHS Sueanne Margarita, MD   150 mg at 11/14/15 2223   Lab Results:  Results for orders placed or performed during the hospital encounter  of 11/09/15 (from the past 48 hour(s))  TSH     Status: None   Collection Time: 11/14/15  6:26 AM  Result Value Ref Range   TSH 1.726 0.350 - 4.500 uIU/mL    Comment: Performed at [facility] D 25 Hydroxy (Vit-D Deficiency, Fractures)     Status: Abnormal   Collection Time: 11/14/15  6:26 AM  Result Value Ref Range   Vit D, 25-Hydroxy 19.6 (L) 30.0 - 100.0 ng/mL    Comment: (NOTE) Vitamin D deficiency has been defined by the Institute of Medicine and an Endocrine Society practice guideline as a level of serum 25-OH vitamin D less than 20 ng/mL (1,2). The Endocrine Society went on to further define vitamin D insufficiency as a level between 21 and 29 ng/mL (2). 1. IOM (Institute of Medicine). 2010. Dietary reference   intakes for calcium and D. [place]: The   [employer]. 2. Holick MF, Binkley [place], Bischoff-Ferrari HA, et al.   Evaluation, treatment, and prevention of vitamin D   deficiency: an Endocrine Society clinical practice   guideline. JCEM. 2011 Jul; 96(7):1911-30. Performed At: [facility] [place], [place] [id number] Lindon Romp MD [id number] Performed at [facility]    Blood Alcohol level:  No results found for: [facility]  Metabolic Disorder Labs: No results found for: HGBA1C, MPG No results found for: PROLACTIN No results found for: CHOL, TRIG, HDL, CHOLHDL, VLDL, LDLCALC  Physical Findings: AIMS: Facial and Oral Movements Muscles of Facial Expression: None, normal Lips and Perioral Area: None, normal Jaw: None, normal Tongue: None, normal,Extremity Movements Upper (arms, wrists, hands, fingers): None, normal Lower (legs, knees, ankles, toes): None, normal, Trunk Movements Neck, shoulders, hips: None, normal, Overall Severity Severity of abnormal movements (highest score from questions above): None, normal Incapacitation due to abnormal movements: None, normal Patient's  awareness of abnormal movements (rate only patient's report): No Awareness, Dental Status Current problems with teeth and/or dentures?: No Does patient usually wear dentures?: No  CIWA:  CIWA-Ar Total: 4 COWS:  COWS Total Score: 0  Musculoskeletal: Strength & Muscle Tone: within normal limits Gait & Station: normal Patient leans: N/A  Psychiatric Specialty Exam: Physical Exam  ROS headache improved , denies nausea,  vomiting , denies diarrhea  Blood pressure (!) 101/58, pulse 96, temperature 98.7 F (37.1 C), temperature source Oral, resp. rate 16, height 5' 4"  (1.626 m), weight 160 lb (72.6 kg), SpO2 100 %.Body mass index is 27.46 kg/m.  General Appearance: well groomed   Eye Contact:   good  Speech:  Normal Rate  Volume:  Normal  Mood: improved, states she feels better  Affect:  more reactive, smiles often   Thought Process:  Goal Directed  Orientation:  Full (Time, Place, and Person)  Thought Content:  no hallucinations, no delusions   Suicidal Thoughts: At this time denies plan or intention of suicide or self injurious ideations, contracts for safety on unit   Homicidal Thoughts:  No  Memory:  recent and remote grossly intact   Judgement:  Improving   Insight:  improving   Psychomotor Activity:  Normal  Concentration:  Concentration: good and Attention Span: good   Recall:  good   Fund of Knowledge:  good  Language: good   Akathisia:  No  Handed:  Right  AIMS (if indicated):     Assets:  Communication Skills Desire for Improvement Financial Resources/Insurance Housing Physical Health Social Support Transportation  ADL's:  Intact  Cognition:  WNL  Sleep:  Number of Hours: 6.5      Assessment -patient presents with improved mood and range of affect , currently denies suicidal ideations and is future oriented. As she improves she is focusing more on discharge planning , and is hoping for discharge soon. Denies any suicidal ideations . Thus far tolerating Zoloft  trial and Buspar titration well .  Treatment Plan Summary: Daily contact with patient to assess and evaluate symptoms and progress in treatment and Medication management. Encourage ongoing group and milieu participation to work on Radiographer, therapeutic and symptom reduction Depression: Increase Zoloft to 100 mgrs QDAY  Increased Anxiety: Continue  Buspar  46m  TID. Insomnia: Continue  Trazodone 150 mg QHS Headache: Continue Fioricet 1-2 tablets Q 6 hours PRN- patient states she tolerates this medication well and finds it helps  Vitamin D deficiency: start Vitamin D supplementation Treatment team working on disposition planning options   CNeita Garnet MD 11/15/2015, 4:44 PM  Patient ID: RMargit Hanks female   DOB: [birth year] 25y.o.   MRN: [id number]

## 2015-11-15 NOTE — Tx Team (Signed)
Interdisciplinary Treatment and Diagnostic Plan Update  11/15/2015 Time of Session: 12:59 PM  Sarah ShropshireRandall Harned Good MRN: [id number]  Principal Diagnosis: MDD  Secondary Diagnoses: Active Problems:   Major depressive disorder, recurrent severe without psychotic features (HCC)   Current Medications:  Current Facility-Administered Medications  Medication Dose Route Frequency Provider Last Rate Last Dose  . acetaminophen (TYLENOL) tablet 650 mg  650 mg Oral Q6H PRN Charm RingsJamison Y Lord, NP   650 mg at 11/15/15 0806  . alum & mag hydroxide-simeth (MAALOX/MYLANTA) 200-200-20 MG/5ML suspension 30 mL  30 mL Oral Q4H PRN Charm RingsJamison Y Lord, NP      . busPIRone (BUSPAR) tablet 15 mg  15 mg Oral TID Craige CottaFernando A Cobos, MD   15 mg at 11/15/15 1256  . butalbital-acetaminophen-caffeine (FIORICET, ESGIC) 50-325-40 MG per tablet 2 tablet  2 tablet Oral Q6H PRN Charm RingsJamison Y Lord, NP   1 tablet at 11/14/15 0804  . hydrOXYzine (ATARAX/VISTARIL) tablet 50 mg  50 mg Oral Q8H PRN Craige CottaFernando A Cobos, MD   50 mg at 11/14/15 2223  . magnesium hydroxide (MILK OF MAGNESIA) suspension 30 mL  30 mL Oral Daily PRN Charm RingsJamison Y Lord, NP      . sertraline (ZOLOFT) tablet 50 mg  50 mg Oral Daily Craige CottaFernando A Cobos, MD   50 mg at 11/15/15 0805  . traZODone (DESYREL) tablet 150 mg  150 mg Oral QHS Molinda BailiffUreh N Lekwauwa, MD   150 mg at 11/14/15 2223    PTA Medications: Prescriptions Prior to Admission  Medication Sig Dispense Refill Last Dose  . fexofenadine (ALLEGRA ALLERGY) 180 MG tablet Take 180 mg by mouth daily.   11/08/2015  . ibuprofen (ADVIL,MOTRIN) 200 MG tablet Take 400 mg by mouth every 6 (six) hours as needed for moderate pain.    11/08/2015  . Levonorgestrel (MIRENA, 52 MG, IU) 1 Device by Intrauterine route once.   more than a month  . ondansetron (ZOFRAN) 4 MG tablet Take 1 tablet (4 mg total) by mouth every 8 (eight) hours as needed for nausea or vomiting. (Patient not taking: Reported on 11/09/2015) 12 tablet 0     Treatment  Modalities: Medication Management, Group therapy, Case management,  1 to 1 session with clinician, Psychoeducation, Recreational therapy.  Patient Stressors: Marital or family conflict  Patient Strengths: Average or above average intelligence Capable of independent living Communication skills General fund of knowledge Physical Health Supportive family/friends Work [employer]  Physician Treatment Plan for Primary Diagnosis: MDD Long Term Goal(s): Improvement in symptoms so as ready for discharge  Short Term Goals: Ability to identify changes in lifestyle to reduce recurrence of condition will improve, Ability to verbalize feelings will improve, Ability to disclose and discuss suicidal ideas, Ability to demonstrate self-control will improve, Ability to identify and develop effective coping behaviors will improve, Ability to maintain clinical measurements within normal limits will improve, Compliance with prescribed medications will improve and Ability to identify triggers associated with substance abuse/mental health issues will improve  Medication Management: Evaluate patient's response, side effects, and tolerance of medication regimen.  Therapeutic Interventions: 1 to 1 sessions, Unit Group sessions and Medication administration.  Evaluation of Outcomes: Adequate for Discharge  Physician Treatment Plan for Secondary Diagnosis: Active Problems:   Major depressive disorder, recurrent severe without psychotic features (HCC)   Long Term Goal(s): Improvement in symptoms so as ready for discharge  Short Term Goals: Ability to identify changes in lifestyle to reduce recurrence of condition will improve, Ability to verbalize feelings will improve,  Ability to disclose and discuss suicidal ideas, Ability to demonstrate self-control will improve, Ability to identify and develop effective coping behaviors will improve, Ability to maintain clinical measurements within normal limits will improve,  Compliance with prescribed medications will improve and Ability to identify triggers associated with substance abuse/mental health issues will improve  Medication Management: Evaluate patient's response, side effects, and tolerance of medication regimen.  Therapeutic Interventions: 1 to 1 sessions, Unit Group sessions and Medication administration.  Evaluation of Outcomes: Adequate for Discharge   RN Treatment Plan for Primary Diagnosis: MDD Long Term Goal(s): Knowledge of disease and therapeutic regimen to maintain health will improve  Short Term Goals: Ability to remain free from injury will improve, Ability to verbalize feelings will improve, Ability to disclose and discuss suicidal ideas and Ability to identify and develop effective coping behaviors will improve  Medication Management: RN will administer medications as ordered by provider, will assess and evaluate patient's response and provide education to patient for prescribed medication. RN will report any adverse and/or side effects to prescribing provider.  Therapeutic Interventions: 1 on 1 counseling sessions, Psychoeducation, Medication administration, Evaluate responses to treatment, Monitor vital signs and CBGs as ordered, Perform/monitor CIWA, COWS, AIMS and Fall Risk screenings as ordered, Perform wound care treatments as ordered.  Evaluation of Outcomes: Adequate for Discharge   LCSW Treatment Plan for Primary Diagnosis: MDD Long Term Goal(s): Safe transition to appropriate next level of care at discharge, Engage patient in therapeutic group addressing interpersonal concerns.  Short Term Goals: Engage patient in aftercare planning with referrals and resources, Increase emotional regulation, Facilitate acceptance of mental health diagnosis and concerns and Increase skills for wellness and recovery  Therapeutic Interventions: Assess for all discharge needs, 1 to 1 time with Social worker, Explore available resources and  support systems, Assess for adequacy in community support network, Educate family and significant other(s) on suicide prevention, Complete Psychosocial Assessment, Interpersonal group therapy.  Evaluation of Outcomes: Adequate for Discharge   Progress in Treatment: Attending groups: Yes Participating in groups: Yes Taking medication as prescribed: Yes, MD continues to assess for medication changes as needed Toleration medication: Yes, no side effects reported at this time Family/Significant other contact made: No, CSW assessing for appropriate contact Patient understands diagnosis: Yes AEB seeking help with depression Discussing patient identified problems/goals with staff: Yes Medical problems stabilized or resolved: Yes Denies suicidal/homicidal ideation: Yes Issues/concerns per patient self-inventory: None Other: N/A  New problem(s) identified: None identified at this time.   New Short Term/Long Term Goal(s): None identified at this time.   Discharge Plan or Barriers: Pt will return home and follow-up with outpatient services.   Reason for Continuation of Hospitalization: Anxiety Depression Medication stabilization Suicidal ideation  Estimated Length of Stay: 1 day  Attendees: Patient: 11/15/2015  12:59 PM  Physician: Dr. Seth Bake 11/15/2015  12:59 PM  Nursing: Marlyn Corporal, RN 11/15/2015  12:59 PM  RN Care Manager: Onnie Boer, RN 11/15/2015  12:59 PM  Social Worker: Chad Cordial, LCSW; Kristin Drinkard, LCSW 11/15/2015  12:59 PM  Recreational Therapist:  11/15/2015  12:59 PM  Other: Armandina Stammer, NP 11/15/2015  12:59 PM  Other:  11/15/2015  12:59 PM  Other: 11/15/2015  12:59 PM    Scribe for Treatment Team: Elaina Hoops, LCSW 11/15/2015 12:59 PM

## 2015-11-15 NOTE — Progress Notes (Signed)
Patient ID: Sarah Good, female   DOB: [birth year], 25 y.o.   MRN: [id number]  DAR: Pt. Denies SI/HI and A/V Hallucinations. She reports sleep is poor, appetite is good, energy level is low, and concentration is good. She rates depression 4/10, hopelessness 3/10, and anxiety 7/10. Patient does report a mild headache this morning and received PRN Tylenol. Support and encouragement provided to the patient. Scheduled medications administered to patient per physician's orders. Patient is seen in the milieu and is cooperative. She is seen in the milieu interacting with peers. Patient requested writer to give another patient her number and patient was notified that staff discourages this. She reported feeling dizzy and lightheaded this morning. Patient was given a pitcher of water and encouraged to drink plenty of fluids. Patient reported after lunch that she felt much better and denied dizziness and lightheadedness. Q15 minute checks are maintained for safety.

## 2015-11-16 MED ORDER — BUSPIRONE HCL 15 MG PO TABS: 15.0000 mg | ORAL_TABLET | Freq: Three times a day (TID) | ORAL | 0 refills | Status: DC

## 2015-11-16 MED ORDER — SERTRALINE HCL 100 MG PO TABS: 100.0000 mg | ORAL_TABLET | Freq: Every day | ORAL | 0 refills | Status: DC

## 2015-11-16 MED ORDER — TRAZODONE HCL 150 MG PO TABS: 150.0000 mg | ORAL_TABLET | Freq: Every day | ORAL | 0 refills | Status: DC

## 2015-11-16 NOTE — Progress Notes (Signed)
D:  Patient's self inventory sheet, patient has fair sleep, sleep medication is helpful.  Good appetite, normal energy level, good concentration.  Rated depression and hopeless 5, anxiety 6.  Denied withdrawals.  Denied SI.  Denied physical problems.  Denied pain.  Goal is discharge.  Plans to talk to MD and SW.  Does have discharge plans. A:  Medications administered per MD orders.  Emotional support and encouragement given patient. R:  Denied SI and HI, contracts for safety.  Denied a/V hallucinations.  Safety maintained with 15 minute checks.

## 2015-11-16 NOTE — Discharge Summary (Signed)
Physician Discharge Summary Note  Patient:  Sarah Good is an 25 y.o., female MRN:  [id number] DOB:  02/14/1990 Patient phone:  [phone] (home)  Patient address:   [address] [place] [address],  Total Time spent with patient: 30 minutes  Date of Admission:  11/09/2015 Date of Discharge: 11/16/2015  Reason for Admission:  overdose  Principal Problem: Major depressive disorder, recurrent severe without psychotic features [name]) Discharge Diagnoses: Patient Active Problem List   Diagnosis Date Noted  . Major depressive disorder, recurrent severe without psychotic features (HCC) [F33.2] 11/09/2015    Priority: High  . Pyelonephritis [N12] 09/20/2015  . Nausea [R11.0] 09/20/2015  . Leukocytosis [D72.829] 09/20/2015  . Hypokalemia [E87.6] 09/20/2015  . Anxiety [F41.9] 09/20/2015  . Nephrolithiasis [N20.0]     Past Psychiatric History: see HPI  Past Medical History:  Past Medical History:  Diagnosis Date  . Migraine   . Mitral valve prolapse     Past Surgical History:  Procedure Laterality Date  . WISDOM TOOTH EXTRACTION     Family History:  Family History  Problem Relation Age of Onset  . Adopted: Yes   Family Psychiatric  History: see HPI Social History:  History  Alcohol Use  . Yes    Comment: socially     History  Drug Use  . Types: Marijuana    Comment: Second-hand    Social History   Social History  . Marital status: Single    Spouse name: N/A  . Number of children: N/A  . Years of education: N/A   Social History Main Topics  . Smoking status: Never Smoker  . Smokeless tobacco: Never Used  . Alcohol use Yes     Comment: socially  . Drug use:     Types: Marijuana     Comment: Second-hand  . Sexual activity: Not Asked   Other Topics Concern  . None   Social History Narrative  . None    Hospital Course:   Sarah Good, 25 year old single female with long history of multiple depressive and anxiety symptoms including  suicidal ideations was voluntarily admitted here because of escalating symptoms over the past several months.  Current stressors included starting a new job and a lot of financial stress. She lives with 2 room-mate. Patient wanted to overdose with a friends klonopin.    Sarah Good was admitted for Major depressive disorder, recurrent severe without psychotic features (HCC) and crisis management.  Patient was treated with medications with their indications listed below in detail under Medication List.  Medical problems were identified and treated as needed.  Home medications were restarted as appropriate.  Improvement was monitored by observation and Sarah Good daily report of symptom reduction.  Emotional and mental status was monitored by daily self inventory reports completed by Sarah Good and clinical staff.  Patient reported continued improvement, denied any new concerns.  Patient had been compliant on medications and denied side effects.  Support and encouragement was provided.    Patient encouraged to attend groups to help with recognizing triggers of emotional crises and de-stabilizations.  Patient encouraged to attend group to help identify the positive things in life that would help in dealing with feelings of loss, depression and unhealthy or abusive tendencies.         Sarah Good was evaluated by the treatment team for stability and plans for continued recovery upon discharge.  Patient was offered further treatment options upon discharge including Residential,  Intensive Outpatient and Outpatient treatment. Patient will follow up with agency listed below for medication management and counseling.  Encouraged patient to maintain satisfactory support network and home environment.  Advised to adhere to medication compliance and outpatient treatment follow up.  Prescriptions provided.       Sarah Good motivation was an integral factor for scheduling  further treatment.  Employment, transportation, bed availability, health status, family support, and any pending legal issues were also considered during patient's hospital stay.  Upon completion of this admission the patient was both mentally and medically stable for discharge denying suicidal/homicidal ideation, auditory/visual/tactile hallucinations, delusional thoughts and paranoia.      Physical Findings: AIMS: Facial and Oral Movements Muscles of Facial Expression: None, normal Lips and Perioral Area: None, normal Jaw: None, normal Tongue: None, normal,Extremity Movements Upper (arms, wrists, hands, fingers): None, normal Lower (legs, knees, ankles, toes): None, normal, Trunk Movements Neck, shoulders, hips: None, normal, Overall Severity Severity of abnormal movements (highest score from questions above): None, normal Incapacitation due to abnormal movements: None, normal Patient's awareness of abnormal movements (rate only patient's report): No Awareness, Dental Status Current problems with teeth and/or dentures?: No Does patient usually wear dentures?: No  CIWA:  CIWA-Ar Total: 4 COWS:  COWS Total Score: 0  Musculoskeletal: Strength & Muscle Tone: within normal limits Gait & Station: normal Patient leans: N/A  Psychiatric Specialty Exam: Physical Exam  Nursing note and vitals reviewed.   ROS  Blood pressure (!) 96/57, pulse (!) 122, temperature 97.8 F (36.6 C), temperature source Oral, resp. rate 18, height 5\' 4"  (1.626 m), weight 72.6 kg (160 lb), SpO2 100 %.Body mass index is 27.46 kg/m.   Have you used any form of tobacco in the last 30 days? (Cigarettes, Smokeless Tobacco, Cigars, and/or Pipes): No  Has this patient used any form of tobacco in the last 30 days? (Cigarettes, Smokeless Tobacco, Cigars, and/or Pipes) Yes, N/A  Blood Alcohol level:  No results found for: [facility]  Metabolic Disorder Labs:  No results found for: HGBA1C, MPG No results found for:  PROLACTIN No results found for: CHOL, TRIG, HDL, CHOLHDL, VLDL, LDLCALC  See Psychiatric Specialty Exam and Suicide Risk Assessment completed by Attending Physician prior to discharge.  Discharge destination:  Home  Is patient on multiple antipsychotic therapies at discharge:  No   Has Patient had three or more failed trials of antipsychotic monotherapy by history:  No  Recommended Plan for Multiple Antipsychotic Therapies: NA     Medication List    STOP taking these medications   ALLEGRA ALLERGY 180 MG tablet Generic drug:  fexofenadine   ibuprofen 200 MG tablet Commonly known as:  ADVIL,MOTRIN   MIRENA (52 MG) IU   ondansetron 4 MG tablet Commonly known as:  ZOFRAN     TAKE these medications     Indication  busPIRone 15 MG tablet Commonly known as:  BUSPAR Take 1 tablet (15 mg total) by mouth 3 (three) times daily.  Indication:  Generalized Anxiety Disorder   sertraline 100 MG tablet Commonly known as:  ZOLOFT Take 1 tablet (100 mg total) by mouth daily. Start taking on:  11/17/2015  Indication:  Major Depressive Disorder   traZODone 150 MG tablet Commonly known as:  DESYREL Take 1 tablet (150 mg total) by mouth at bedtime.  Indication:  Trouble Sleeping        Follow-up recommendations:  Activity:  as tol Diet:  as tol  Comments:  1.  Take all your medications  as prescribed.   2.  Report any adverse side effects to outpatient provider. 3.  Patient instructed to not use alcohol or illegal drugs while on prescription medicines. 4.  In the event of worsening symptoms, instructed patient to call 911, the crisis hotline or go to nearest emergency room for evaluation of symptoms.  Signed: Lindwood Qua, NP [facility] 11/16/2015, 10:31 AM   Agree with NP progress note as above

## 2015-11-16 NOTE — [facility] Group Notes (Signed)
The focus of this group is to educate the patient on the purpose and policies of crisis stabilization and provide a format to answer questions about their admission.  The group details unit policies and expectations of patients while admitted.  Patient did not attend 0900 nurse education orientation group this morning.  Patient stayed in her room. 

## 2015-11-16 NOTE — Progress Notes (Signed)
Discharge Note:  Patient discharged home with girlfriend.  Patient denied SI and HI.  Denied A/Vhallucinations.  Patient stated she appreciated all assistance received [facility].  Stated she received all her belongings, clothing, toiletries, misc items, prescriptions, medications. Patient stated she received all her discharge information.  All required discharge information given to patient at discharge.

## 2015-11-16 NOTE — [facility] Suicide Risk Assessment (Addendum)
[facility] Discharge Suicide Risk Assessment   Principal Problem: Major depressive disorder, recurrent severe without psychotic features [facility]) Discharge Diagnoses:  Patient Active Problem List   Diagnosis Date Noted  . Major depressive disorder, recurrent severe without psychotic features (HCC) [F33.2] 11/09/2015  . Pyelonephritis [N12] 09/20/2015  . Nausea [R11.0] 09/20/2015  . Leukocytosis [D72.829] 09/20/2015  . Hypokalemia [E87.6] 09/20/2015  . Anxiety [F41.9] 09/20/2015  . Nephrolithiasis [N20.0]     Total Time spent with patient: 30 minutes  Musculoskeletal: Strength & Muscle Tone: within normal limits Gait & Station: normal Patient leans: N/A  Psychiatric Specialty Exam: ROS  No headache today, no chest pain, no shortness of breath, no vomiting   Blood pressure (!) 96/57, pulse (!) 122, temperature 97.8 F (36.6 C), temperature source Oral, resp. rate 18, height 5\' 4"  (1.626 m), weight 160 lb (72.6 kg), SpO2 100 %.Body mass index is 27.46 kg/m.  General Appearance: improved grooming   Eye Contact::  Good  Speech:  Normal Rate409  Volume:  Normal  Mood:  improved mood, states she is feeling better  Affect:  Appropriate and fuller in range   Thought Process:  Linear  Orientation:  Full (Time, Place, and Person)  Thought Content:  denies hallucinations, no delusions, not internally preoccupied   Suicidal Thoughts:  No- denies suicidal ideations,no self injurious ideations  Homicidal Thoughts:  No  Memory:  Recent and remote  Grossly intact   Judgement:  Other:  improving   Insight:  improved   Psychomotor Activity:  Normal  Concentration:  Good  Recall:  Good  Fund of Knowledge:Good  Language: Good  Akathisia:  Negative  Handed:  Right  AIMS (if indicated):     Assets:  Desire for Improvement Resilience  Sleep:  Number of Hours: 5  Cognition: WNL  ADL's:  Intact   Mental Status Per Nursing Assessment::   On Admission:  Suicidal ideation indicated by patient,  Suicide plan, Plan includes specific time, place, or method, Self-harm thoughts, Intention to act on suicide plan  Demographic Factors:  25 year old single female, lives with roommates, employed   Loss Factors: Recently started new job, financial difficulties   Historical Factors: History of depression, history of prior psychiatric admissions   Risk Reduction Factors:   Employed, Living with another person, especially a relative, Positive social support and Positive coping skills or problem solving skills  Continued Clinical Symptoms:  At this time patient is improved compared to admission - presents euthymic, with a full range of affect, no thought disorder, no suicidal ideations, no homicidal ideations, no psychotic symptoms, future oriented and wanting to return to work soon. Denies medication side effects  Cognitive Features That Contribute To Risk:  No gross cognitive deficits noted upon discharge. Is alert , attentive, and oriented x 3    Suicide Risk:  Mild:  Suicidal ideation of limited frequency, intensity, duration, and specificity.  There are no identifiable plans, no associated intent, mild dysphoria and related symptoms, good self-control (both objective and subjective assessment), few other risk factors, and identifiable protective factors, including available and accessible social support.  Follow-up Information    [facility] .   Contact information: [address] [place] [address] [phone]       [facility] .   Contact information: [address] [place] [address] [phone]           Plan Of Care/Follow-up recommendations:  Activity:  as tolerated  Diet:  Regular  Tests:  NA Other:  see below Patient is leaving unit in good spirits  Plans to return home - lives with roommates  Follow up as above  Patient to continue Vitamin D supplementation- follow up with PCP Nehemiah MassedOBOS,  Keevon Henney, MD 11/16/2015, 11:26 AM

## 2015-11-16 NOTE — [facility] Suicide Risk Assessment (Signed)
[facility] INPATIENT:  Family/Significant Other Suicide Prevention Education  Suicide Prevention Education:  Education Completed; Sarah CoventrySusan Good, Pt's roommate [phone], has been identified by the patient as the family member/significant other with whom the patient will be residing, and identified as the person(s) who will aid the patient in the event of a mental health crisis (suicidal ideations/suicide attempt).  With written consent from the patient, the family member/significant other has been provided the following suicide prevention education, prior to the and/or following the discharge of the patient.  The suicide prevention education provided includes the following:  Suicide risk factors  Suicide prevention and interventions  National Suicide Hotline telephone number  [facility] assessment telephone number  [facility] Emergency Assistance 911  [facility] and/or Residential Mobile Crisis Unit telephone number  Request made of family/significant other to:  Remove weapons (e.g., guns, rifles, knives), all items previously/currently identified as safety concern.    Remove drugs/medications (over-the-counter, prescriptions, illicit drugs), all items previously/currently identified as a safety concern.  The family member/significant other verbalizes understanding of the suicide prevention education information provided.  The family member/significant other agrees to remove the items of safety concern listed above.  Sarah HoopsLauren [name] 11/16/2015, 10:40 AM

## 2015-11-16 NOTE — Progress Notes (Signed)
  [facility] :  Will you be returning to the same living situation after discharge:  Yes,  Pt returning home At discharge, do you have transportation home?: Yes,  Pt roommate to pick up Do you have the ability to pay for your medications: Yes,  Pt provided with prescriptions  Release of information consent forms completed and in the chart;  Patient's signature needed at discharge.  Patient to Follow up at: Follow-up Information    [facility] .   Why:  October 12th at 1 pm for your initial evaluation with Sarah Good and October 17th at 1:30 pm for medication management with Sarah Good  Contact information: [address] [place] [id number] [phone]       [facility] Follow up on 11/22/2015.   Why:  at 1:00pm with Sarah Good for therapy. Contact information: [address] [place] [address] [phone]           Next level of care provider has access to [facility] Link:no  Safety Planning and Suicide Prevention discussed: Yes,  with roommate; see SPE note  Have you used any form of tobacco in the last 30 days? (Cigarettes, Smokeless Tobacco, Cigars, and/or Pipes): No  Has patient been referred to the Quitline?: N/A patient is not a smoker  Patient has been referred for addiction treatment: Yes  Sarah Good 11/16/2015, 12:48 PM

## 2015-11-16 NOTE — Progress Notes (Signed)
D. Pt has been up and visible in milieu this evening, did attend and participate in evening group activity. She spoke about how she is feeling better and spoke about how she is hopeful for discharge soon. She did endorse some mild anxiety this evening and did receive all medications without incident and did not verbalize any complaints of pain. A. Support and encouragement provided. R. Safety maintained, will continue to monitor.

## 2015-11-19 ENCOUNTER — Inpatient Hospital Stay (HOSPITAL_COMMUNITY)
Admission: RE | Admit: 2015-11-19 | Discharge: 2015-11-27 | DRG: 885 | Disposition: A | Discharge status: Home / Self Care | Payer: 59 | Attending: Psychiatry | Admitting: Psychiatry

## 2015-11-19 ENCOUNTER — Encounter (HOSPITAL_COMMUNITY): Payer: Self-pay | Admitting: *Deleted

## 2015-11-19 DIAGNOSIS — I341 Nonrheumatic mitral (valve) prolapse: Secondary | ICD-10-CM | POA: Diagnosis present

## 2015-11-19 DIAGNOSIS — Z915 Personal history of self-harm: Secondary | ICD-10-CM | POA: Diagnosis not present

## 2015-11-19 DIAGNOSIS — Z79899 Other long term (current) drug therapy: Secondary | ICD-10-CM | POA: Diagnosis not present

## 2015-11-19 DIAGNOSIS — F332 Major depressive disorder, recurrent severe without psychotic features: Secondary | ICD-10-CM | POA: Diagnosis present

## 2015-11-19 DIAGNOSIS — Z87442 Personal history of urinary calculi: Secondary | ICD-10-CM

## 2015-11-19 DIAGNOSIS — Z888 Allergy status to other drugs, medicaments and biological substances status: Secondary | ICD-10-CM | POA: Diagnosis not present

## 2015-11-19 DIAGNOSIS — R42 Dizziness and giddiness: Secondary | ICD-10-CM | POA: Diagnosis present

## 2015-11-19 DIAGNOSIS — F1099 Alcohol use, unspecified with unspecified alcohol-induced disorder: Secondary | ICD-10-CM | POA: Diagnosis not present

## 2015-11-19 DIAGNOSIS — R079 Chest pain, unspecified: Secondary | ICD-10-CM

## 2015-11-19 DIAGNOSIS — R0789 Other chest pain: Secondary | ICD-10-CM | POA: Diagnosis present

## 2015-11-19 DIAGNOSIS — R45851 Suicidal ideations: Secondary | ICD-10-CM | POA: Diagnosis present

## 2015-11-19 DIAGNOSIS — E559 Vitamin D deficiency, unspecified: Secondary | ICD-10-CM | POA: Diagnosis present

## 2015-11-19 HISTORY — DX: Anxiety disorder, unspecified: F41.9

## 2015-11-19 MED ORDER — MAGNESIUM HYDROXIDE 400 MG/5ML PO SUSP: 30.0000 mL | Freq: Every day | ORAL | Status: DC | PRN

## 2015-11-19 MED ORDER — VITAMIN D3 25 MCG (1000 UNIT) PO TABS
1000.0000 [IU] | ORAL_TABLET | Freq: Every day | ORAL | Status: DC
  Administered 2015-11-20 – 2015-11-27 (×8): 1000 [IU] via ORAL
  Filled 2015-11-19 (×12): qty 1

## 2015-11-19 MED ORDER — SERTRALINE HCL 100 MG PO TABS
100.0000 mg | ORAL_TABLET | Freq: Every day | ORAL | Status: DC
  Administered 2015-11-20: 100 mg via ORAL
  Filled 2015-11-19 (×2): qty 1

## 2015-11-19 MED ORDER — ACETAMINOPHEN 325 MG PO TABS
650.0000 mg | ORAL_TABLET | Freq: Four times a day (QID) | ORAL | Status: DC | PRN
  Administered 2015-11-22 – 2015-11-25 (×3): 650 mg via ORAL
  Filled 2015-11-19 (×3): qty 2

## 2015-11-19 MED ORDER — TRAZODONE HCL 150 MG PO TABS
150.0000 mg | ORAL_TABLET | Freq: Every day | ORAL | Status: DC
  Administered 2015-11-19 – 2015-11-26 (×8): 150 mg via ORAL
  Filled 2015-11-19 (×12): qty 1

## 2015-11-19 MED ORDER — ALUM & MAG HYDROXIDE-SIMETH 200-200-20 MG/5ML PO SUSP: 30.0000 mL | ORAL | Status: DC | PRN

## 2015-11-19 MED ORDER — HYDROXYZINE HCL 25 MG PO TABS
25.0000 mg | ORAL_TABLET | Freq: Three times a day (TID) | ORAL | Status: DC | PRN
  Administered 2015-11-19 – 2015-11-26 (×12): 25 mg via ORAL
  Filled 2015-11-19 (×12): qty 1

## 2015-11-19 MED ORDER — BUSPIRONE HCL 15 MG PO TABS
15.0000 mg | ORAL_TABLET | Freq: Three times a day (TID) | ORAL | Status: DC
  Administered 2015-11-20 – 2015-11-27 (×23): 15 mg via ORAL
  Filled 2015-11-19 (×31): qty 1

## 2015-11-19 MED ORDER — TRAZODONE HCL 50 MG PO TABS
50.0000 mg | ORAL_TABLET | Freq: Every evening | ORAL | Status: DC | PRN
  Filled 2015-11-19 (×2): qty 1
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
## 2015-11-19 NOTE — H&P (Signed)
Behavioral Health Medical Screening Exam  [name] Sarah Good is an 25 y.o. female who [facility]. Reports that she has been feeling severely depressed [facility]. Reports that she has been taking her medications as prescribed and "nothing has happened to make me feel this way, I just don't care what happens to me." She has a therapist appointment scheduled for 11/22/15, but States,  "I cannot make it until Wednesday." Reports suicidal ideations with a "fuzzy plan". States "I would like to collect enough sedatives and take them, so that I do not wake back up." Denies audiovisual hallucinations.  Total Time spent with patient: 30 minutes  Psychiatric Specialty Exam: Physical Exam  Constitutional: She is oriented to person, place, and time. She appears well-developed and well-nourished. No distress.  HENT:  Head: Normocephalic and atraumatic.  Eyes: Conjunctivae and EOM are normal. Right eye exhibits no discharge. Left eye exhibits no discharge. No scleral icterus.  Cardiovascular: Normal rate, regular rhythm and normal heart sounds.  Exam reveals no gallop and no friction rub.   No murmur heard. Respiratory: Effort normal and breath sounds normal. No respiratory distress. She has no wheezes. She has no rales. She exhibits no tenderness.  Musculoskeletal: Normal range of motion.  Neurological: She is alert and oriented to person, place, and time.  Skin: Skin is warm and dry. She is not diaphoretic.    Review of Systems  Psychiatric/Behavioral: Positive for depression and suicidal ideas. Negative for hallucinations, memory loss and substance abuse. The patient is nervous/anxious and has insomnia.   All other systems reviewed and are negative.   Blood pressure 128/75, pulse 75, temperature 98.2 F (36.8 C), resp. rate 17, height 5\' 4"  (1.626 m), weight 76.7 kg (169 lb), SpO2 99 %.Body mass index is 29.01 kg/m.  General Appearance:  Casual  Eye Contact:  Fair  Speech:  Normal Rate  Volume:  Normal  Mood:  Anxious, Depressed and Hopeless  Affect:  Blunt and Depressed  Thought Process:  Coherent  Orientation:  Full (Time, Place, and Person)  Thought Content:  WDL  Suicidal Thoughts:  Yes.  with intent/plan  Homicidal Thoughts:  No  Memory:  Immediate;   Good Recent;   Good Remote;   Good  Judgement:  Good  Insight:  Good  Psychomotor Activity:  Normal  Concentration: Concentration: Good  Recall:  Good  Fund of Knowledge:Good  Language: Good  Akathisia:  Negative  Handed:  Right  AIMS (if indicated):     Assets:  Communication Skills Desire for Improvement Social Support  Sleep:       Musculoskeletal: Strength & Muscle Tone: within normal limits Gait & Station: normal Patient leans: N/A  Blood pressure 128/75, pulse 75, temperature 98.2 F (36.8 C), resp. rate 17, height 5\' 4"  (1.626 m), weight 76.7 kg (169 lb), SpO2 99 %.  Recommendations:  Based on my evaluation the patient does not appear to have an emergency medical condition. Recommend inpatient admission.  Jackelyn PolingJason A Alanea Woolridge, NP 11/19/2015, 9:09 PM

## 2015-11-19 NOTE — H&P (Deleted)
Behavioral Health Medical Screening Exam  Sarah Good is a 25 y.o. female [facility]. Reports that she has been feeling severely depressed [facility]. Reports that she has been taking her medications as prescribed and "nothing has happened to make me feel this way, I just don't care what happens to me." She has a therapist appointment scheduled for 11/22/15, but States,  "I cannot make it until Wednesday." Reports suicidal ideations with a "fuzzy plan". States "I would like to collect enough sedatives and take them, so that I do not wake back up." Denies audiovisual hallucinations.  Total Time spent with patient: 30 minutes  Psychiatric Specialty Exam: Physical Exam  ROS  Blood pressure 132/85, pulse 66, temperature 98 F (36.7 C), temperature source Oral, resp. rate 18, height 5\' 4"  (1.626 m), weight 72.6 kg (160 lb), SpO2 100 %.Body mass index is 27.46 kg/m.  General Appearance: Casual  Eye Contact:  Fair  Speech:  Normal Rate  Volume:  Normal  Mood:  Anxious, Depressed, Hopeless and Worthless  Affect:  Blunt and Depressed  Thought Process:  Coherent  Orientation:  Full (Time, Place, and Person)  Thought Content:  WDL  Suicidal Thoughts:  Yes.  with intent/plan  Homicidal Thoughts:  No  Memory:  Immediate;   Good Recent;   Good Remote;   Good  Judgement:  Good  Insight:  Good  Psychomotor Activity:  Normal  Concentration: Concentration: Good  Recall:  Good  Fund of Knowledge:Good  Language: Good  Akathisia:  No  Handed:  Right  AIMS (if indicated):     Assets:  Communication Skills Desire for Improvement Housing Social Support  Sleep:  Number of Hours: 5    Musculoskeletal: Strength & Muscle Tone: within normal limits Gait & Station: normal Patient leans: N/A  Blood pressure 132/85, pulse 66, temperature 98 F (36.7 C), temperature source Oral, resp. rate 18, height 5\' 4"  (1.626 m), weight  72.6 kg (160 lb), SpO2 100 %.  Recommendations:  Based on my evaluation the patient does not appear to have an emergency medical condition. Recommend inpatient admission.  Jackelyn PolingJason A Delyla Sandeen, NP 11/19/2015, 8:33 PM

## 2015-11-19 NOTE — Progress Notes (Signed)
Patient sts she was discharged from here on Thursday night.  Patient c/o depression and anxiety that has increased each day since Thursday.  Patients roommate brought her here due to having suicidial thoughts with no plan.  Patients seems manic and restless. Patient just recently got a job at a [employer].

## 2015-11-20 DIAGNOSIS — R45851 Suicidal ideations: Secondary | ICD-10-CM

## 2015-11-20 DIAGNOSIS — R45850 Homicidal ideations: Secondary | ICD-10-CM

## 2015-11-20 DIAGNOSIS — Z888 Allergy status to other drugs, medicaments and biological substances status: Secondary | ICD-10-CM

## 2015-11-20 LAB — RAPID URINE DRUG SCREEN, HOSP PERFORMED
AMPHETAMINES: NOT DETECTED
BENZODIAZEPINES: NOT DETECTED
Barbiturates: NOT DETECTED
Cocaine: NOT DETECTED
Opiates: NOT DETECTED
Tetrahydrocannabinol: NOT DETECTED

## 2015-11-20 LAB — PREGNANCY, URINE: PREG TEST UR: NEGATIVE

## 2015-11-20 MED ORDER — GABAPENTIN 100 MG PO CAPS
200.0000 mg | ORAL_CAPSULE | Freq: Two times a day (BID) | ORAL | Status: DC
Start: 2015-11-20 — End: 2015-11-21
  Administered 2015-11-20 – 2015-11-21 (×3): 200 mg via ORAL
  Filled 2015-11-20 (×5): qty 2

## 2015-11-20 MED ORDER — INFLUENZA VAC SPLIT QUAD 0.5 ML IM SUSY
0.5000 mL | PREFILLED_SYRINGE | INTRAMUSCULAR | Status: AC
  Filled 2015-11-20: qty 0.5

## 2015-11-20 MED ORDER — SERTRALINE HCL 50 MG PO TABS
150.0000 mg | ORAL_TABLET | Freq: Every day | ORAL | Status: DC
  Administered 2015-11-21 – 2015-11-23 (×3): 150 mg via ORAL
  Filled 2015-11-20 (×4): qty 3

## 2015-11-20 NOTE — [facility] Group Notes (Signed)
Writer went over the rules of the unit and ask each patient how their day was. Patient stated her day was rough at times anxiety and depression is up. Patient stated her day was a 3 out of 10.

## 2015-11-20 NOTE — [facility] Suicide Risk Assessment (Signed)
[facility]   Nursing information obtained from:    Demographic factors:  Adolescent or young adult Current Mental Status:  Suicidal ideation indicated by patient Loss Factors:    Historical Factors:    Risk Reduction Factors:  Sense of responsibility to family  Total Time spent with patient: 1 hour Principal Problem: <principal problem not specified> Diagnosis:   Patient Active Problem List   Diagnosis Date Noted  . Suicidal ideation [R45.851] 11/19/2015  . Major depressive disorder, recurrent severe without psychotic features (HCC) [F33.2] 11/09/2015  . Pyelonephritis [N12] 09/20/2015  . Nausea [R11.0] 09/20/2015  . Leukocytosis [D72.829] 09/20/2015  . Hypokalemia [E87.6] 09/20/2015  . Anxiety [F41.9] 09/20/2015  . Nephrolithiasis [N20.0]    Subjective Data: See admission assessment  Continued Clinical Symptoms:    The "Alcohol Use Disorders Identification Test", Guidelines for Use in Primary Care, Second Edition.  World Science writerHealth Organization [facility](WHO). Score between 0-7:  no or low risk or alcohol related problems. Score between 8-15:  moderate risk of alcohol related problems. Score between 16-19:  high risk of alcohol related problems. Score 20 or above:  warrants further diagnostic evaluation for alcohol dependence and treatment.   CLINICAL FACTORS:   Panic Attacks Depression:   Impulsivity Personality Disorders:   Cluster B Comorbid depression   Musculoskeletal: Strength & Muscle Tone:See admission assessment  Psychiatric Specialty Exam: Physical Exam  ROS  Blood pressure 96/69, pulse (!) 115, temperature 97.9 F (36.6 C), temperature source Oral, resp. rate 18, height 5\' 4"  (1.626 m), weight 76.7 kg (169 lb), SpO2 99 %.Body mass index is 29.01 kg/m.      COGNITIVE FEATURES THAT CONTRIBUTE TO RISK:  Polarized thinking    SUICIDE RISK:   Moderate:  Frequent suicidal ideation with limited intensity, and duration, some specificity in  terms of plans, no associated intent, good self-control, limited dysphoria/symptomatology, some risk factors present, and identifiable protective factors, including available and accessible social support.   PLAN OF CARE: see admission assessment  I certify that inpatient services furnished can reasonably be expected to improve the patient's condition.  Wynelle BourgeoisUreh N Lekauwa, MD 11/20/2015, 10:32 AM

## 2015-11-20 NOTE — H&P (Signed)
Psychiatric Admission Assessment Adult  Patient Identification: Sarah Good MRN:  [id number] Date of Evaluation:  11/20/2015 Chief Complaint:  MDD Principal Diagnosis: <principal problem not specified> Diagnosis:   Patient Active Problem List   Diagnosis Date Noted  . Suicidal ideation [R45.851] 11/19/2015  . Major depressive disorder, recurrent severe without psychotic features (HCC) [F33.2] 11/09/2015  . Pyelonephritis [N12] 09/20/2015  . Nausea [R11.0] 09/20/2015  . Leukocytosis [D72.829] 09/20/2015  . Hypokalemia [E87.6] 09/20/2015  . Anxiety [F41.9] 09/20/2015  . Nephrolithiasis [N20.0]    History of Present Illness:Patient is an 25 y.o. single female who presents unaccompanied to [facility] reporting symptoms of depression, including suicidal ideation. Patient  was inpatient at [facility] 11/09/15-11/16/15. She states since discharge she had felt increasingly depressed and scales her current depression as 9/10. Patient  reports symptoms including crying spells, social withdrawal, loss of interest in usual pleasures, fatigue, decreased concentration, decreased appetite and feelings of hopelessness. Patient  reports she has lost ten pounds in the past 4-6 weeks. She reports experiencing 3-4 panic attacks per day. She  Stated on admission, "I don't see a point in being alive." Patient continues to  report suicidal ideation . She does not have a specific plan because she is here. She  denies any history of suicide attempts. She reports a history of cutting and says she last cut in June 2017. She  denies homicidal ideation or history of violence. Patient  denies any history of psychotic symptoms and t denies alcohol or substance abuse. Associated Signs/Symptoms: Depression Symptoms:  depressed mood, anhedonia, feelings of worthlessness/guilt, hopelessness, recurrent thoughts of death, anxiety, panic attacks, weight loss, (Hypo) Manic Symptoms:  nONE Anxiety Symptoms:  Panic  Symptoms, Psychotic Symptoms:  None PTSD Symptoms: None Total Time spent with patient: 1 hour  Past Psychiatric History: Patient has had long histor of mood instability , multiple attempts at self mutilation and poor impulse control and 5 4 prior hospitalizations  Is the patient at risk to self? Yes.    Has the patient been a risk to self in the past 6 months? Yes.    Has the patient been a risk to self within the distant past? Yes.    Is the patient a risk to others? No.  Has the patient been a risk to others in the past 6 months? No.  Has the patient been a risk to others within the distant past? No.   Prior Inpatient Therapy: Prior Inpatient Therapy: Yes Prior Therapy Dates: 11/09/15-11/16/15; 02/2015; multiple admits Prior Therapy Facilty/Provider(s): [facility], In [place] Reason for Treatment: depression Prior Outpatient Therapy: Prior Outpatient Therapy: No Prior Therapy Dates: NA Prior Therapy Facilty/Provider(s): NA Reason for Treatment: NA Does patient have an ACCT team?: No Does patient have Intensive In-House Services?  : No Does patient have [facility] services? : No Does patient have P4CC services?: No  Alcohol Screening: Patient refused Alcohol Screening Tool: Yes Substance Abuse History in the last 12 months:  No. Consequences of Substance Abuse: NA Previous Psychotropic Medications: Yes  Psychological Evaluations: Yes --Showed Borderline Personality Disorder Past Medical History:  Past Medical History:  Diagnosis Date  . Anxiety   . Migraine   . Mitral valve prolapse     Past Surgical History:  Procedure Laterality Date  . WISDOM TOOTH EXTRACTION     Family History:  Family History  Problem Relation Age of Onset  . Adopted: Yes   Family Psychiatric  History: Adopted and does not know Tobacco Screening: Have you  used any form of tobacco in the last 30 days? (Cigarettes, Smokeless Tobacco, Cigars, and/or Pipes): No Social History:  History  Alcohol  Use  . Yes    Comment: socially     History  Drug Use  . Types: Marijuana    Comment: Second-hand    Additional Social History: Marital status: Single    Pain Medications: Pt denies Prescriptions: See MAR Over the Counter: See MAR History of alcohol / drug use?: No history of alcohol / drug abuse Longest period of sobriety (when/how long): NA                    Allergies:   Allergies  Allergen Reactions  . Iodine Anaphylaxis   Lab Results: No results found for this or any previous visit (from the past 48 hour(s)).  Blood Alcohol level:  No results found for: [facility]  Metabolic Disorder Labs:  No results found for: HGBA1C, MPG No results found for: PROLACTIN No results found for: CHOL, TRIG, HDL, CHOLHDL, VLDL, LDLCALC  Current Medications: Current Facility-Administered Medications  Medication Dose Route Frequency Provider Last Rate Last Dose  . acetaminophen (TYLENOL) tablet 650 mg  650 mg Oral Q6H PRN Jackelyn Poling, NP      . alum & mag hydroxide-simeth (MAALOX/MYLANTA) 200-200-20 MG/5ML suspension 30 mL  30 mL Oral Q4H PRN Jackelyn Poling, NP      . busPIRone (BUSPAR) tablet 15 mg  15 mg Oral TID Jackelyn Poling, NP   15 mg at 11/20/15 0840  . cholecalciferol (VITAMIN D) tablet 1,000 Units  1,000 Units Oral Daily Jackelyn Poling, NP   1,000 Units at 11/20/15 0840  . hydrOXYzine (ATARAX/VISTARIL) tablet 25 mg  25 mg Oral TID PRN Jackelyn Poling, NP   25 mg at 11/19/15 2200  . [START ON 11/21/2015] Influenza vac split quadrivalent PF (FLUARIX) injection 0.5 mL  0.5 mL Intramuscular Tomorrow-1000 [name], MD      . magnesium hydroxide (MILK OF MAGNESIA) suspension 30 mL  30 mL Oral Daily PRN Jackelyn Poling, NP      . sertraline (ZOLOFT) tablet 100 mg  100 mg Oral Daily Jackelyn Poling, NP   100 mg at 11/20/15 0840  . traZODone (DESYREL) tablet 150 mg  150 mg Oral QHS Jackelyn Poling, NP   150 mg at 11/19/15 2200   PTA Medications: Prescriptions Prior to Admission   Medication Sig Dispense Refill Last Dose  . cholecalciferol (VITAMIN D) 1000 units tablet Take 1,000 Units by mouth daily.     . busPIRone (BUSPAR) 15 MG tablet Take 1 tablet (15 mg total) by mouth 3 (three) times daily. 90 tablet 0   . sertraline (ZOLOFT) 100 MG tablet Take 1 tablet (100 mg total) by mouth daily. 30 tablet 0   . traZODone (DESYREL) 150 MG tablet Take 1 tablet (150 mg total) by mouth at bedtime. 30 tablet 0     Musculoskeletal: Strength & Muscle Tone: within normal limits Gait & Station: normal Patient leans: N/A  Psychiatric Specialty Exam: Physical Exam  ROS  Blood pressure 96/69, pulse (!) 115, temperature 97.9 F (36.6 C), temperature source Oral, resp. rate 18, height 5\' 4"  (1.626 m), weight 76.7 kg (169 lb), SpO2 99 %.Body mass index is 29.01 kg/m.  General Appearance: Casual  Eye Contact:  Fair  Speech:  Clear and Coherent and Normal Rate  Volume:  Normal  Mood:  Anxious and Depressed  Affect:  depressed  Thought Process:  Coherent and Goal Directed  Orientation:  Full (Time, Place, and Person)  Thought Content:  Logical  Suicidal Thoughts:  Yes.  with intent/plan  Homicidal Thoughts:  Yes.  with intent/plan  Memory:  Immediate;   Fair Recent;   Fair Remote;   Fair  Judgement:  Fair  Insight:  Fair  Psychomotor Activity:  Normal  Concentration:  Concentration: Fair and Attention Span: Fair  Recall:  [employer]Fair  Fund of Knowledge:  Fair  Language:  Fair  Akathisia:  No  Handed:  Right  AIMS (if indicated):     Assets:  Communication Skills Desire for Improvement Financial Resources/Insurance Housing Physical Health Social Support Transportation  ADL's:  Intact  Cognition:  WNL  Sleep:  Number of Hours: 5.5    Treatment Plan Summary: Daily contact with patient to assess and evaluate symptoms and progress in treatment, Medication management and Plan is for patient to participate in group, milieu and individual activities  Observation  Level/Precautions:  15 minute checks  Laboratory:    Psychotherapy:    Medications:  Continue present medications, add neurontin for anxiety  Consultations:    Discharge Concerns:    7  Other:     Physician Treatment Plan for Primary Diagnosis: <principal problem not specified> Long Term Goal(s): Improvement in symptoms so as ready for discharge  Short Term Goals: Ability to identify changes in lifestyle to reduce recurrence of condition will improve, Ability to verbalize feelings will improve, Ability to disclose and discuss suicidal ideas, Ability to demonstrate self-control will improve, Ability to identify and develop effective coping behaviors will improve, Ability to maintain clinical measurements within normal limits will improve, Compliance with prescribed medications will improve and Ability to identify triggers associated with substance abuse/mental health issues will improve  Physician Treatment Plan for Secondary Diagnosis: Active Problems:   Suicidal ideation  Long Term Goal(s): Improvement in symptoms so as ready for discharge  Short Term Goals: Ability to identify changes in lifestyle to reduce recurrence of condition will improve, Ability to verbalize feelings will improve, Ability to disclose and discuss suicidal ideas, Ability to demonstrate self-control will improve, Ability to identify and develop effective coping behaviors will improve, Ability to maintain clinical measurements within normal limits will improve, Compliance with prescribed medications will improve and Ability to identify triggers associated with substance abuse/mental health issues will improve  I certify that inpatient services furnished can reasonably be expected to improve the patient's condition.    Wynelle BourgeoisUreh N Lekauwa, MD 10/9/201710:08 AM

## 2015-11-20 NOTE — Progress Notes (Signed)
D: Pt SI-contract for safety. Pt is pleasant and cooperative. Pt stated she had increased anxiety, but pt stated she wants IOP on D/C.   A: Pt was offered support and encouragement. Pt was given scheduled medications. Pt was encourage to attend groups. Q 15 minute checks were done for safety.   R:Pt attends groups and interacts well with peers and staff. Pt is taking medication. Pt has no complaints.Pt receptive to treatment and safety maintained on unit.

## 2015-11-20 NOTE — [facility] Group Notes (Signed)
[facility] LCSW Group Therapy  11/20/2015 1:15pm  Type of Therapy:  Group Therapy vercoming Obstacles  Participation Level:  Active  Participation Quality:  Appropriate   Affect:  Appropriate  Cognitive:  Appropriate and Oriented  Insight:  Developing/Improving and Improving  Engagement in Therapy:  Improving  Modes of Intervention:  Discussion, Exploration, Problem-solving and Support  Description of Group:   In this group patients will be encouraged to explore what they see as obstacles to their own wellness and recovery. They will be guided to discuss their thoughts, feelings, and behaviors related to these obstacles. The group will process together ways to cope with barriers, with attention given to specific choices patients can make. Each patient will be challenged to identify changes they are motivated to make in order to overcome their obstacles. This group will be process-oriented, with patients participating in exploration of their own experiences as well as giving and receiving support and challenge from other group members.  Summary of Patient Progress: Pt was actively involved in group discussion and identified medication trials as a current obstacle. She was able to offer insight gained from previous obstacles, notably her realization that just because one area of her life may be very difficult does not mean that she cannot excel or find joy in other areas of her life.    Therapeutic Modalities:   Cognitive Behavioral Therapy Solution Focused Therapy Motivational Interviewing Relapse Prevention Therapy   Chad CordialLauren Carter, LCSWA 11/20/2015 4:23 PM

## 2015-11-20 NOTE — Progress Notes (Signed)
Pt denies HI, denies hallucinations, denies pain.  Pt reports SI.  She verbally contracts for safety.  Pt complains of anxiety.  PRN medication administered for anxiety.  Encouragement and support offered.  Medication administered per order.  Pt verbally contracts for safety.  Will continue to monitor and assess.

## 2015-11-20 NOTE — Tx Team (Signed)
Interdisciplinary Treatment and Diagnostic Plan Update  11/20/2015 Time of Session: 8:15 AM  Sarah Good MRN: [id number]  Principal Diagnosis: MDD  Secondary Diagnoses: Active Problems:   Suicidal ideation   Current Medications:  Current Facility-Administered Medications  Medication Dose Route Frequency Provider Last Rate Last Dose  . acetaminophen (TYLENOL) tablet 650 mg  650 mg Oral Q6H PRN Rozetta Nunnery, NP      . alum & mag hydroxide-simeth (MAALOX/MYLANTA) 200-200-20 MG/5ML suspension 30 mL  30 mL Oral Q4H PRN Rozetta Nunnery, NP      . busPIRone (BUSPAR) tablet 15 mg  15 mg Oral TID Rozetta Nunnery, NP   15 mg at 11/20/15 0840  . cholecalciferol (VITAMIN D) tablet 1,000 Units  1,000 Units Oral Daily Rozetta Nunnery, NP   1,000 Units at 11/20/15 0840  . hydrOXYzine (ATARAX/VISTARIL) tablet 25 mg  25 mg Oral TID PRN Rozetta Nunnery, NP   25 mg at 11/19/15 2200  . [START ON 11/21/2015] Influenza vac split quadrivalent PF (FLUARIX) injection 0.5 mL  0.5 mL Intramuscular Tomorrow-1000 [name], MD      . magnesium hydroxide (MILK OF MAGNESIA) suspension 30 mL  30 mL Oral Daily PRN Rozetta Nunnery, NP      . sertraline (ZOLOFT) tablet 100 mg  100 mg Oral Daily Rozetta Nunnery, NP   100 mg at 11/20/15 0840  . traZODone (DESYREL) tablet 150 mg  150 mg Oral QHS Rozetta Nunnery, NP   150 mg at 11/19/15 2200    PTA Medications: Prescriptions Prior to Admission  Medication Sig Dispense Refill Last Dose  . cholecalciferol (VITAMIN D) 1000 units tablet Take 1,000 Units by mouth daily.     . busPIRone (BUSPAR) 15 MG tablet Take 1 tablet (15 mg total) by mouth 3 (three) times daily. 90 tablet 0   . sertraline (ZOLOFT) 100 MG tablet Take 1 tablet (100 mg total) by mouth daily. 30 tablet 0   . traZODone (DESYREL) 150 MG tablet Take 1 tablet (150 mg total) by mouth at bedtime. 30 tablet 0     Treatment Modalities: Medication Management, Group therapy, Case management,  1 to 1 session with  clinician, Psychoeducation, Recreational therapy.  Patient Stressors:    Patient Strengths: Ability for insight Active sense of humor Average or above average intelligence Capable of independent living Occupational psychologist fund of knowledge Motivation for treatment/growth Physical Health Supportive family/friends Work Artist for Primary Diagnosis: MDD Long Term Goal(s): Improvement in symptoms so as ready for discharge  Short Term Goals: Ability to identify changes in lifestyle to reduce recurrence of condition will improve, Ability to verbalize feelings will improve, Ability to disclose and discuss suicidal ideas, Ability to demonstrate self-control will improve, Ability to identify and develop effective coping behaviors will improve, Ability to maintain clinical measurements within normal limits will improve, Compliance with prescribed medications will improve and Ability to identify triggers associated with substance abuse/mental health issues will improve  Medication Management: Evaluate patient's response, side effects, and tolerance of medication regimen.  Therapeutic Interventions: 1 to 1 sessions, Unit Group sessions and Medication administration.  Evaluation of Outcomes: Not Met  Physician Treatment Plan for Secondary Diagnosis: Active Problems:   Suicidal ideation   Long Term Goal(s): Improvement in symptoms so as ready for discharge  Short Term Goals: Ability to identify changes in lifestyle to reduce recurrence of condition will improve, Ability to verbalize feelings will improve,  Ability to disclose and discuss suicidal ideas, Ability to demonstrate self-control will improve, Ability to identify and develop effective coping behaviors will improve, Ability to maintain clinical measurements within normal limits will improve, Compliance with prescribed medications will improve and Ability to identify triggers associated with  substance abuse/mental health issues will improve  Medication Management: Evaluate patient's response, side effects, and tolerance of medication regimen.  Therapeutic Interventions: 1 to 1 sessions, Unit Group sessions and Medication administration.  Evaluation of Outcomes: Not Met   RN Treatment Plan for Primary Diagnosis: MDD Long Term Goal(s): Knowledge of disease and therapeutic regimen to maintain health will improve  Short Term Goals: Ability to remain free from injury will improve, Ability to verbalize feelings will improve, Ability to disclose and discuss suicidal ideas and Ability to identify and develop effective coping behaviors will improve  Medication Management: RN will administer medications as ordered by provider, will assess and evaluate patient's response and provide education to patient for prescribed medication. RN will report any adverse and/or side effects to prescribing provider.  Therapeutic Interventions: 1 on 1 counseling sessions, Psychoeducation, Medication administration, Evaluate responses to treatment, Monitor vital signs and CBGs as ordered, Perform/monitor CIWA, COWS, AIMS and Fall Risk screenings as ordered, Perform wound care treatments as ordered.  Evaluation of Outcomes: Not Met   LCSW Treatment Plan for Primary Diagnosis: MDD Long Term Goal(s): Safe transition to appropriate next level of care at discharge, Engage patient in therapeutic group addressing interpersonal concerns.  Short Term Goals: Engage patient in aftercare planning with referrals and resources, Increase emotional regulation, Facilitate acceptance of mental health diagnosis and concerns and Increase skills for wellness and recovery  Therapeutic Interventions: Assess for all discharge needs, 1 to 1 time with Social worker, Explore available resources and support systems, Assess for adequacy in community support network, Educate family and significant other(s) on suicide prevention,  Complete Psychosocial Assessment, Interpersonal group therapy.  Evaluation of Outcomes: Not Met   Progress in Treatment: Attending groups: Pt is new to milieu, continuing to assess  Participating in groups: Pt is new to milieu, continuing to assess  Taking medication as prescribed: Yes, MD continues to assess for medication changes as needed Toleration medication: Yes, no side effects reported at this time Family/Significant other contact made: No, CSW assessing for appropriate contact Patient understands diagnosis: Continuing to assess Discussing patient identified problems/goals with staff: Yes Medical problems stabilized or resolved: Yes Denies suicidal/homicidal ideation: No, recently admitted with SI Issues/concerns per patient self-inventory: None Other: N/A  New problem(s) identified: None identified at this time.   New Short Term/Long Term Goal(s): None identified at this time.   Discharge Plan or Barriers: Pt will return home and follow-up with outpatient services.   Reason for Continuation of Hospitalization: Anxiety Depression Medication stabilization Suicidal ideation  Estimated Length of Stay: 3-5 days  Attendees: Patient: 11/20/2015  8:15 AM  Physician: Dr. Ananias Pilgrim 11/20/2015  8:15 AM  Nursing: Darrol Angel, Idell Pickles 11/20/2015  8:15 AM  RN Care Manager: Lars Pinks, RN 11/20/2015  8:15 AM  Social Worker: Peri Maris, LCSW;  11/20/2015  8:15 AM  Recreational Therapist:  11/20/2015  8:15 AM  Other: Ethelle Lyon; NP 11/20/2015  8:15 AM  Other:  11/20/2015  8:15 AM  Other: 11/20/2015  8:15 AM    Scribe for Treatment Team: Bo Mcclintock, LCSW 11/20/2015 8:15 AM
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
## 2015-11-20 NOTE — Progress Notes (Signed)
Recreation Therapy Notes  Date: 11/20/15 Time: 0930 Location: [address] Group Room  Group Topic: Stress Management  Goal Area(s) Addresses:  Patient will verbalize importance of using healthy stress management.  Patient will identify positive emotions associated with healthy stress management.   Intervention: Guided Imagery  Activity :  Depression Imagery.  LRT introduced the technique of guided imagery to the patients.  LRT read a script allowing patients to participate in the activity.  Patients were to follow along as LRT read script.  Education:  Stress Management, Discharge Planning.   Education Outcome: Acknowledges edcuation/In group clarification offered/Needs additional education  Clinical Observations/Feedback: Pt did not attend group.     Caroll RancherMarjette Tye Vigo, LRT/CTRS         Caroll RancherLindsay, Marsel Gail A 11/20/2015 11:53 AM

## 2015-11-21 MED ORDER — GABAPENTIN 100 MG PO CAPS
200.0000 mg | ORAL_CAPSULE | Freq: Three times a day (TID) | ORAL | Status: DC
Start: 2015-11-21 — End: 2015-11-23
  Administered 2015-11-21 – 2015-11-23 (×6): 200 mg via ORAL
  Filled 2015-11-21 (×10): qty 2

## 2015-11-21 NOTE — Progress Notes (Signed)
Recreation Therapy Notes  Animal-Assisted Activity (AAA) Program Checklist/Progress Notes Patient Eligibility Criteria Checklist & Daily Group note for Rec TxIntervention  Date: 10.10.2017 Time: 3:00pm Location: [address] [facility]    AAA/T Program Assumption of Risk Form signed by Patient/ or Parent Legal Guardian Yes  Patient is free of allergies or sever asthma Yes  Patient reports no fear of animals Yes  Patient reports no history of cruelty to animals Yes  Patient understands his/her participation is voluntary Yes  Patient washes hands before animal contact Yes  Patient washes hands after animal contact Yes  Behavioral Response: Engaged, Appropriate   Education:Hand Washing, Appropriate Animal Interaction   Education Outcome: Acknowledges education.   Clinical Observations/Feedback: Patient attended session and interacted appropriately with therapy dog and peers. Patient asked appropriate questions about therapy dog and his training. Patient shared stories about their pets at home with group.   Marykay Lexenise L Takeem Krotzer, LRT/CTRS        Jalayna Josten L 11/21/2015 3:13 PM

## 2015-11-21 NOTE — Progress Notes (Signed)
Patient stated [facility], did not take flu vaccine today as scheduled.

## 2015-11-21 NOTE — Plan of Care (Signed)
Problem: Education: Goal: Ability to state activities that reduce stress will improve Outcome: Progressing Nurse discussed depression/anxety/coping skills with patient.

## 2015-11-21 NOTE — Progress Notes (Signed)
D:  Patient's self inventory sheet, patient has fair sleep, sleep medication is helpful.  Poor appetite, low energy level, poor concentration.  Rated depression and anxiety 9, hopeless 8.  Denied withdrawals.  SI, contracts for safety, no plan.  Denied physical problems. Denied pain.  Goal is to talk to MD about different discharge.  Plans to stay awake.  No discharge plans. A:  Medications administered per MD orders.  Emotional support and encouragement given patient. R:  SI, no plan, contracts for safety.  Denied HI.  Patient stated she is still SI to harm herself but not to kill herself.  No specific plan but would hurt or kill herself if discharged.  Denied HI.  Denied A/V hallucinations.  Denied pain.  Thinks of harming bugs.

## 2015-11-21 NOTE — [facility] Group Notes (Signed)

## 2015-11-21 NOTE — [facility] Group Notes (Signed)
[facility] LCSW Group Therapy 11/21/2015 1:15 PM  Type of Therapy: Group Therapy- Feelings about Diagnosis  Participation Level: Active   Participation Quality:  Appropriate  Affect:  Appropriate  Cognitive: Alert and Oriented   Insight:  Developing   Engagement in Therapy: Developing/Improving and Engaged   Modes of Intervention: Clarification, Confrontation, Discussion, Education, Exploration, Limit-setting, Orientation, Problem-solving, Rapport Building, Dance movement psychotherapisteality Testing, Socialization and Support  Description of Group:   This group will allow patients to explore their thoughts and feelings about diagnoses they have received. Patients will be guided to explore their level of understanding and acceptance of these diagnoses. Facilitator will encourage patients to process their thoughts and feelings about the reactions of others to their diagnosis, and will guide patients in identifying ways to discuss their diagnosis with significant others in their lives. This group will be process-oriented, with patients participating in exploration of their own experiences as well as giving and receiving support and challenge from other group members.  Summary of Progress/Problems:  Pt identified that she was accepting of her diagnoses such as depression and anxiety; however she had a more difficulty time with accepting the diagnosis of Borderline Personality Disorder because she feels that it is more pervasive. Pt interacted well with peers.   Therapeutic Modalities:   Cognitive Behavioral Therapy Solution Focused Therapy Motivational Interviewing Relapse Prevention Therapy  Chad CordialLauren Carter, LCSWA 11/21/2015 4:01 PM

## 2015-11-21 NOTE — Progress Notes (Signed)
[facility]  11/21/2015 11:19 AM Kellie ShropshireRandall Harned [name]  MRN:  [id number] Subjective:  Patient report anxious  and depressed mood 7/10 and 9/10 respectively. Patient remains suicidal but able to contract for safety Principal Problem: <principal problem not specified> Diagnosis:   Patient Active Problem List   Diagnosis Date Noted  . Suicidal ideation [R45.851] 11/19/2015  . Major depressive disorder, recurrent severe without psychotic features (HCC) [F33.2] 11/09/2015  . Pyelonephritis [N12] 09/20/2015  . Nausea [R11.0] 09/20/2015  . Leukocytosis [D72.829] 09/20/2015  . Hypokalemia [E87.6] 09/20/2015  . Anxiety [F41.9] 09/20/2015  . Nephrolithiasis [N20.0]    Total Time spent with patient: 20 minutes  Past Psychiatric History: see admission assessment  Past Medical History:  Past Medical History:  Diagnosis Date  . Anxiety   . Migraine   . Mitral valve prolapse     Past Surgical History:  Procedure Laterality Date  . WISDOM TOOTH EXTRACTION     Family History:  Family History  Problem Relation Age of Onset  . Adopted: Yes   Family Psychiatric  History: see admission assessment Social History:  History  Alcohol Use  . Yes    Comment: socially     History  Drug Use  . Types: Marijuana    Comment: Second-hand    Social History   Social History  . Marital status: Single    Spouse name: N/A  . Number of children: N/A  . Years of education: N/A   Social History Main Topics  . Smoking status: Never Smoker  . Smokeless tobacco: Never Used  . Alcohol use Yes     Comment: socially  . Drug use:     Types: Marijuana     Comment: Second-hand  . Sexual activity: Yes    Birth control/ protection: IUD   Other Topics Concern  . None   Social History Narrative  . None   Additional Social History:    Pain Medications: Pt denies Prescriptions: See MAR Over the Counter: See MAR History of alcohol / drug use?: No history of alcohol / drug  abuse Longest period of sobriety (when/how long): NA                    Sleep: Fair  Appetite:  Poor  Current Medications: Current Facility-Administered Medications  Medication Dose Route Frequency Provider Last Rate Last Dose  . acetaminophen (TYLENOL) tablet 650 mg  650 mg Oral Q6H PRN Jackelyn PolingJason A Berry, NP      . alum & mag hydroxide-simeth (MAALOX/MYLANTA) 200-200-20 MG/5ML suspension 30 mL  30 mL Oral Q4H PRN Jackelyn PolingJason A Berry, NP      . busPIRone (BUSPAR) tablet 15 mg  15 mg Oral TID Jackelyn PolingJason A Berry, NP   15 mg at 11/21/15 0749  . cholecalciferol (VITAMIN D) tablet 1,000 Units  1,000 Units Oral Daily Jackelyn PolingJason A Berry, NP   1,000 Units at 11/21/15 0749  . gabapentin (NEURONTIN) capsule 200 mg  200 mg Oral BID Molinda BailiffUreh N Mykenzie Ebanks, MD   200 mg at 11/21/15 0748  . hydrOXYzine (ATARAX/VISTARIL) tablet 25 mg  25 mg Oral TID PRN Jackelyn PolingJason A Berry, NP   25 mg at 11/20/15 2206  . magnesium hydroxide (MILK OF MAGNESIA) suspension 30 mL  30 mL Oral Daily PRN Jackelyn PolingJason A Berry, NP      . sertraline (ZOLOFT) tablet 150 mg  150 mg Oral Daily Molinda BailiffUreh N Lark Runk, MD   150 mg at 11/21/15 0748  . traZODone (DESYREL) tablet  150 mg  150 mg Oral QHS Jackelyn Poling, NP   150 mg at 11/20/15 2206    Lab Results:  Results for orders placed or performed during the hospital encounter of 11/19/15 (from the past 48 hour(s))  Pregnancy, urine     Status: None   Collection Time: 11/20/15  1:15 PM  Result Value Ref Range   Preg Test, Ur NEGATIVE NEGATIVE    Comment:        THE SENSITIVITY OF THIS METHODOLOGY IS >20 mIU/mL. Performed at [facility]   Urine rapid drug screen (hosp performed)not at [facility]     Status: None   Collection Time: 11/20/15  1:15 PM  Result Value Ref Range   Opiates NONE DETECTED NONE DETECTED   Cocaine NONE DETECTED NONE DETECTED   Benzodiazepines NONE DETECTED NONE DETECTED   Amphetamines NONE DETECTED NONE DETECTED   Tetrahydrocannabinol NONE DETECTED NONE DETECTED   Barbiturates  NONE DETECTED NONE DETECTED    Comment:        DRUG SCREEN FOR MEDICAL PURPOSES ONLY.  IF CONFIRMATION IS NEEDED FOR ANY PURPOSE, NOTIFY LAB WITHIN 5 DAYS.        LOWEST DETECTABLE LIMITS FOR URINE DRUG SCREEN Drug Class       Cutoff (ng/mL) Amphetamine      1000 Barbiturate      200 Benzodiazepine   200 Tricyclics       300 Opiates          300 Cocaine          300 THC              50 Performed at [facility]     Blood Alcohol level:  No results found for: [facility]  Metabolic Disorder Labs: No results found for: HGBA1C, MPG No results found for: PROLACTIN No results found for: CHOL, TRIG, HDL, CHOLHDL, VLDL, LDLCALC  Physical Findings: AIMS: Facial and Oral Movements Muscles of Facial Expression: None, normal Lips and Perioral Area: None, normal Jaw: None, normal Tongue: None, normal,Extremity Movements Upper (arms, wrists, hands, fingers): None, normal Lower (legs, knees, ankles, toes): None, normal, Trunk Movements Neck, shoulders, hips: None, normal, Overall Severity Severity of abnormal movements (highest score from questions above): None, normal Incapacitation due to abnormal movements: None, normal Patient's awareness of abnormal movements (rate only patient's report): No Awareness, Dental Status Current problems with teeth and/or dentures?: No Does patient usually wear dentures?: No  CIWA:  CIWA-Ar Total: 1 COWS:  COWS Total Score: 2  Musculoskeletal: Strength & Muscle Tone: within normal limits Gait & Station: normal Patient leans: N/A  Psychiatric Specialty Exam: Physical Exam  Constitutional: She appears well-developed and well-nourished.    ROS  Blood pressure 102/65, pulse 93, temperature 98 F (36.7 C), temperature source Oral, resp. rate 18, height 5\' 4"  (1.626 m), weight 76.7 kg (169 lb), SpO2 99 %.Body mass index is 29.01 kg/m.  General Appearance: Casual  Eye Contact:  Good  Speech:  Clear and Coherent and Normal Rate   Volume:  Normal  Mood:  Depressed  Affect:  Depressed  Thought Process:  Coherent and Goal Directed  Orientation:  Full (Time, Place, and Person)  Thought Content:  Logical  Suicidal Thoughts:  Yes.  without intent/plan  Homicidal Thoughts:  No  Memory:  Immediate;   Fair Recent;   Fair Remote;   Fair  Judgement:  Fair  Insight:  Fair  Psychomotor Activity:  Normal  Concentration:  Concentration: Fair  and Attention Span: Fair  Recall:  [employer] of Knowledge:  Fair  Language:  Fair  Akathisia:  No  Handed:  Right  AIMS (if indicated):     Assets:  Communication Skills Desire for Improvement Financial Resources/Insurance Housing Intimacy Leisure Time Physical Health Social Support  ADL's:  Intact  Cognition:  WNL  Sleep:  Number of Hours: 5.5     Treatment Plan Summary: Daily contact with patient to assess and evaluate symptoms and progress in treatment, Medication management and Plan --continue to participate in group milieu and individual activities  Increase gabapentin to 300mg  tid Wynelle Bourgeois, MD 11/21/2015, 11:19 AM

## 2015-11-21 NOTE — Progress Notes (Signed)
D: Pt passive SI-contracts for safety.  denies HI/AVH. Pt is pleasant and cooperative. Pt was doing fine this evening until pt mother left. Pt mother stated pt was making a mistake by coming here and making a mistake by taking her medications. Pt informed writer that when she is off her medications she starts acting funny (out of control), so pt endorsed the need to be on her medications and the need to be in the hospital at this time while she is SI , at least until she can get thins under control to be managed on and out pt basis.   A: Pt was offered support and encouragement. Pt was given scheduled medications. Pt was encourage to attend groups. Q 15 minute checks were done for safety.   R: Pt is taking medication. Pt has no complaints.Pt receptive to treatment and safety maintained on unit.

## 2015-11-22 DIAGNOSIS — F332 Major depressive disorder, recurrent severe without psychotic features: Principal | ICD-10-CM

## 2015-11-22 DIAGNOSIS — Z79899 Other long term (current) drug therapy: Secondary | ICD-10-CM

## 2015-11-22 NOTE — [facility] Group Notes (Signed)
Patient attend group. Her day was a 5. Her high point was a visit and anxiety problems. She start work on hospital thru outpatient office. She looking forward to outpatient clinic.

## 2015-11-22 NOTE — [facility] Group Notes (Signed)
[facility] LCSW Group Therapy 11/22/2015 1:15 PM  Type of Therapy: Group Therapy- Emotion Regulation  Participation Level: Active   Participation Quality:  Appropriate  Affect: Appropriate  Cognitive: Alert and Oriented   Insight:  Developing/Improving  Engagement in Therapy: Developing/Improving and Engaged   Modes of Intervention: Clarification, Confrontation, Discussion, Education, Exploration, Limit-setting, Orientation, Problem-solving, Rapport Building, Dance movement psychotherapisteality Testing, Socialization and Support  Summary of Progress/Problems: The topic for group today was emotional regulation. This group focused on both positive and negative emotion identification and allowed group members to process ways to identify feelings, regulate negative emotions, and find healthy ways to manage internal/external emotions. Group members were asked to reflect on a time when their reaction to an emotion led to a negative outcome and explored how alternative responses using emotion regulation would have benefited them. Group members were also asked to discuss a time when emotion regulation was utilized when a negative emotion was experienced. Pt continues to be active in group discussion and interact well with peers. Pt expresses that her mother is a trigger for her anger. She reports that she would like her mother to have a better understanding of her emotional distress.   Chad CordialLauren Carter, LCSWA 11/22/2015 3:36 PM

## 2015-11-22 NOTE — Progress Notes (Signed)
Patient ID: Sarah Good, female   DOB: 08-20-1990, 25 y.o.   MRN: [id number] D: Client is visible on the unit, has visit tonight from room mate and best friend. "I had really bad depression and crippling anxiety, current rates depression and anxiety "5" of 10, "best it's been in a month" Client reports "this is the best psychiatric ward I've been on" Client reports admission has been helpful " being around people" and "the dog, I love the dog" "talking to the pharmacist in group, she explained why I'm on the medication" :"I think it would be helpful to know more about discharge planing and about what we can and cannot have on admission" A: Writer provided emotional support, encouraged client to continue to express concerns and as staff is available to assist. Medications reviewed, administered as ordered. Staff will monitor q4615min for safety. R: Client is safe on the unit, attended group.

## 2015-11-22 NOTE — Progress Notes (Signed)
D:Pt presents anxious on approach. Pt rates anxiety 7/10. Depression 9/10. Pt endorses passive suicidal thoughts with no plan or intent. Pt verbally contracts for safety. Pt requesting family session with her mother due to conflict with her mother not understanding the importance of her tx.  A: Medications reviewed with pt. Medications administered as ordered per MD. Verbal support provided. Pt encouraged to attend groups. 15 minute checks performed for safety. R: Pt receptive to tx.

## 2015-11-22 NOTE — Progress Notes (Signed)
[facility]  11/22/2015 11:31 AM Sarah ShropshireRandall Harned Good  MRN:  [id number] Subjective:  She reports feeling better, but still depressed, anxious, and has intermittent passive thoughts of death, but denies any active SI. Denies medication side effects at this time. Today she  spoke about relationship with her mother- states relationship tends to be stressful, because mother " even though she means well, always comes out being very critical, putting me down". States mother does not support her being on psychiatric medications. Attributes some of her depression, anxiety to her family stressors as above . Objectives : I have discussed case with treatment team. Patient has remained calm, no disruptive or agitated behaviors, pleasant on approach. She is visible in day room, milieu. As above, today tends to focus and ruminate on relationship stressors with mother . Reports improvement compared to admission, but still feels depressed,and endorses some ongoing passive thoughts of dying, but denies plan or intention of hurting self or of suicide . Currently tolerating medications well , denies side effects. ( Neurontin trial is new, to help address anxiety)   Principal Problem:  MDD  Diagnosis:   Patient Active Problem List   Diagnosis Date Noted  . Suicidal ideation [R45.851] 11/19/2015  . Major depressive disorder, recurrent severe without psychotic features (HCC) [F33.2] 11/09/2015  . Pyelonephritis [N12] 09/20/2015  . Nausea [R11.0] 09/20/2015  . Leukocytosis [D72.829] 09/20/2015  . Hypokalemia [E87.6] 09/20/2015  . Anxiety [F41.9] 09/20/2015  . Nephrolithiasis [N20.0]    Total Time spent with patient: 20 minutes  Past Psychiatric History: see admission assessment  Past Medical History:  Past Medical History:  Diagnosis Date  . Anxiety   . Migraine   . Mitral valve prolapse     Past Surgical History:  Procedure Laterality Date  . WISDOM TOOTH EXTRACTION     Family History:   Family History  Problem Relation Age of Onset  . Adopted: Yes   Family Psychiatric  History: see admission assessment Social History:  History  Alcohol Use  . Yes    Comment: socially     History  Drug Use  . Types: Marijuana    Comment: Second-hand    Social History   Social History  . Marital status: Single    Spouse name: N/A  . Number of children: N/A  . Years of education: N/A   Social History Main Topics  . Smoking status: Never Smoker  . Smokeless tobacco: Never Used  . Alcohol use Yes     Comment: socially  . Drug use:     Types: Marijuana     Comment: Second-hand  . Sexual activity: Yes    Birth control/ protection: IUD   Other Topics Concern  . None   Social History Narrative  . None   Additional Social History:    Pain Medications: Pt denies Prescriptions: See MAR Over the Counter: See MAR History of alcohol / drug use?: No history of alcohol / drug abuse Longest period of sobriety (when/how long): NA  Sleep:  Improving   Appetite:  Improving   Current Medications: Current Facility-Administered Medications  Medication Dose Route Frequency Provider Last Rate Last Dose  . acetaminophen (TYLENOL) tablet 650 mg  650 mg Oral Q6H PRN Jackelyn PolingJason A Berry, NP      . alum & mag hydroxide-simeth (MAALOX/MYLANTA) 200-200-20 MG/5ML suspension 30 mL  30 mL Oral Q4H PRN Jackelyn PolingJason A Berry, NP      . busPIRone (BUSPAR) tablet 15 mg  15 mg Oral  TID Jackelyn Poling, NP   15 mg at 11/22/15 0750  . cholecalciferol (VITAMIN D) tablet 1,000 Units  1,000 Units Oral Daily Jackelyn Poling, NP   1,000 Units at 11/22/15 0750  . gabapentin (NEURONTIN) capsule 200 mg  200 mg Oral TID Molinda Bailiff, MD   200 mg at 11/22/15 0750  . hydrOXYzine (ATARAX/VISTARIL) tablet 25 mg  25 mg Oral TID PRN Jackelyn Poling, NP   25 mg at 11/21/15 2010  . magnesium hydroxide (MILK OF MAGNESIA) suspension 30 mL  30 mL Oral Daily PRN Jackelyn Poling, NP      . sertraline (ZOLOFT) tablet 150 mg  150 mg  Oral Daily Molinda Bailiff, MD   150 mg at 11/22/15 0750  . traZODone (DESYREL) tablet 150 mg  150 mg Oral QHS Jackelyn Poling, NP   150 mg at 11/21/15 2156    Lab Results:  Results for orders placed or performed during the hospital encounter of 11/19/15 (from the past 48 hour(s))  Pregnancy, urine     Status: None   Collection Time: 11/20/15  1:15 PM  Result Value Ref Range   Preg Test, Ur NEGATIVE NEGATIVE    Comment:        THE SENSITIVITY OF THIS METHODOLOGY IS >20 mIU/mL. Performed at [facility]   Urine rapid drug screen (hosp performed)not at [facility]     Status: None   Collection Time: 11/20/15  1:15 PM  Result Value Ref Range   Opiates NONE DETECTED NONE DETECTED   Cocaine NONE DETECTED NONE DETECTED   Benzodiazepines NONE DETECTED NONE DETECTED   Amphetamines NONE DETECTED NONE DETECTED   Tetrahydrocannabinol NONE DETECTED NONE DETECTED   Barbiturates NONE DETECTED NONE DETECTED    Comment:        DRUG SCREEN FOR MEDICAL PURPOSES ONLY.  IF CONFIRMATION IS NEEDED FOR ANY PURPOSE, NOTIFY LAB WITHIN 5 DAYS.        LOWEST DETECTABLE LIMITS FOR URINE DRUG SCREEN Drug Class       Cutoff (ng/mL) Amphetamine      1000 Barbiturate      200 Benzodiazepine   200 Tricyclics       300 Opiates          300 Cocaine          300 THC              50 Performed at [facility]     Blood Alcohol level:  No results found for: [facility]  Metabolic Disorder Labs: No results found for: HGBA1C, MPG No results found for: PROLACTIN No results found for: CHOL, TRIG, HDL, CHOLHDL, VLDL, LDLCALC  Physical Findings: AIMS: Facial and Oral Movements Muscles of Facial Expression: None, normal Lips and Perioral Area: None, normal Jaw: None, normal Tongue: None, normal,Extremity Movements Upper (arms, wrists, hands, fingers): None, normal Lower (legs, knees, ankles, toes): None, normal, Trunk Movements Neck, shoulders, hips: None, normal, Overall  Severity Severity of abnormal movements (highest score from questions above): None, normal Incapacitation due to abnormal movements: None, normal Patient's awareness of abnormal movements (rate only patient's report): No Awareness, Dental Status Current problems with teeth and/or dentures?: No Does patient usually wear dentures?: No  CIWA:  CIWA-Ar Total: 1 COWS:  COWS Total Score: 2  Musculoskeletal: Strength & Muscle Tone: within normal limits Gait & Station: normal Patient leans: N/A  Psychiatric Specialty Exam: Physical Exam  Constitutional: She appears well-developed and well-nourished.  ROS no chest pain, no shortness of breath, no nausea or vomiting.  Blood pressure 103/61, pulse (!) 103, temperature 97.7 F (36.5 C), temperature source Oral, resp. rate 16, height 5\' 4"  (1.626 m), weight 169 lb (76.7 kg), SpO2 99 %.Body mass index is 29.01 kg/m.  General Appearance: well groomed   Eye Contact:  Good  Speech:  Clear and Coherent and Normal Rate  Volume:  Normal  Mood:  States she continues to feel depressed, sad, although somewhat better than before admission  Affect:  Vaguely constricted, but reactive, and smiles at times appropriately   Thought Process:  Linear, goal directed   Orientation:  Full (Time, Place, and Person)  Thought Content:  Denies hallucinations, no delusions, not internally preoccupied   Suicidal Thoughts:  Describes intermittent passive thoughts of death, but at this time denies any plan or intention and contracts for safety   Homicidal Thoughts:  No  Memory:  Recent and remote grossly intact   Judgement:  Improving   Insight:  Improving   Psychomotor Activity:  Normal  Concentration:  Concentration: Good and Attention Span: Good  Recall:  Good  Fund of Knowledge:  Good  Language:  Good  Akathisia:  No  Handed:  Right  AIMS (if indicated):     Assets:  Communication Skills Desire for Improvement Financial  Resources/Insurance Housing Intimacy Leisure Time Physical Health Social Support  ADL's:  Intact  Cognition:  WNL  Sleep:  Number of Hours: 6.5   Assessment - patient is 25 year old female, who was readmitted soon after recent discharge ( 9/28 - 10/5) . Reported worsening depression, neuro-vegetative symptoms , suicidal ideations, leading to readmission. At this time reports partial improvement , less severely depressed, no active SI, able to contract for safety. Tolerating Buspar, Zoloft, Neurontin well .   Treatment Plan Summary: Daily contact with patient to assess and evaluate symptoms and progress in treatment, Medication management and Plan --continue to participate in group milieu and individual activities  Continue Buspar 15 mgrs TID for anxiety  Continue Neurontin 200 mgrs TID for anxiety  Continue Zoloft 150 mgrs QDAY for depression, anxiety  Continue Trazodone 150 mgrs QHS for insomnia, as needed  Continue Vitamin D supplementation for Vit D deficiency  Treatment team working on disposition plan We discussed possible family meeting with parents- patient ambivalent about this at this time, will consider   Nehemiah Massed, MD 11/22/2015, 11:31 AMPatient ID: Sarah Good, female   DOB: [birth year], 25 y.o.   MRN: [id number]

## 2015-11-22 NOTE — Progress Notes (Signed)
Recreation Therapy Notes  Date: 11/22/15 Time: 0930 Location: 300 Hall Dayroom  Group Topic: Stress Management  Goal Area(s) Addresses:  Patient will verbalize importance of using healthy stress management.  Patient will identify positive emotions associated with healthy stress management.   Intervention: Stress Management  Activity :  Progressive Muscle Relaxation.  LRT introduced the technique of progressive muscle relaxation.  LRT explained that the technique allows patients to relax muscle groups one at a time.  LRT read a script to guide the patients through the technique.  Pt were to follow along as LRT read script to engage in technique.  Education:  Stress Management, Discharge Planning.   Education Outcome: Acknowledges edcuation/In group clarification offered/Needs additional education  Clinical Observations/Feedback: Pt did not attend group.    Caroll RancherMarjette Gerron Guidotti, LRT/CTRS         Lillia AbedLindsay, Taeler Winning A 11/22/2015 1:03 PM

## 2015-11-23 MED ORDER — LORATADINE 10 MG PO TABS
10.0000 mg | ORAL_TABLET | Freq: Every day | ORAL | Status: DC
  Administered 2015-11-23 – 2015-11-27 (×5): 10 mg via ORAL
  Filled 2015-11-23 (×8): qty 1

## 2015-11-23 MED ORDER — SERTRALINE HCL 100 MG PO TABS
200.0000 mg | ORAL_TABLET | Freq: Every day | ORAL | Status: DC
  Administered 2015-11-24 – 2015-11-27 (×4): 200 mg via ORAL
  Filled 2015-11-23 (×7): qty 2

## 2015-11-23 MED ORDER — GABAPENTIN 300 MG PO CAPS
300.0000 mg | ORAL_CAPSULE | Freq: Three times a day (TID) | ORAL | Status: DC
  Administered 2015-11-23 – 2015-11-27 (×13): 300 mg via ORAL
  Filled 2015-11-23 (×20): qty 1

## 2015-11-23 NOTE — Progress Notes (Signed)
D: Pt presents anxious on approach. Pt appears animated during shift assessment. Pt rates anxiety 8/10. Depression 8/10. Pt endorses passive suicidal thoughts with no plan or intent. Pt verbally contracts for safety. Pt reports ongoing conflict with her mother and is requesting a family session with MD. Pt verbalized [facility]. Pt c/o allergy symptoms. Pt requesting medication for allergies.  A: Medications reviewed with pt. Medications administered as ordered per MD. Verbal support provided. Pt encouraged to attend groups. Pt progress discussed in treatment team this morning. MD made aware of pt request for family session. Claritin ordered for seasonal allergies per MD. 15 minute checks performed for safety.  R: Pt receptive to tx.

## 2015-11-23 NOTE — Progress Notes (Signed)
Adult Psychoeducational Group Note  Date:  11/23/2015 Time:  0845 am  Group Topic/Focus:  Orientation:   The focus of this group is to educate the patient on the purpose and policies of crisis stabilization and provide a format to answer questions about their admission.  The group details unit policies and expectations of patients while admitted.   Participation Level:  Active  Participation Quality:  Appropriate  Affect:  Appropriate  Cognitive:  Appropriate  Insight: Appropriate  Engagement in Group:  Engaged  Modes of Intervention:  Discussion and Orientation  Additional Comments:    Tanee Henery L 11/23/2015, 9:23 AM

## 2015-11-23 NOTE — Plan of Care (Signed)
Problem: Safety: Goal: Periods of time without injury will increase Outcome: Progressing Client has been injury free AEB q4715min safety checks, medications review and monitoring. Client has been safe on the unit.

## 2015-11-23 NOTE — Progress Notes (Addendum)
D: Per Candace Cruiseoris R. NS/MHT client came out of dayroom holding her right side complaining of pain. Upon Clinical research associatewriter assessment client sitting in chair, unlabored breathing. Writer advised deep breathing to help alleviate anxiety. A: VS taken  T 97.7, 02Sat 100% RA, sitting BP 110/66, P 67, standing BP 115/78, P 80. R: Client expressed relief after VS taken, but express "I have had mitral valve prolapse" Client went to bed and found reading, calm and relaxed, no other complaints verbalized. Staff will continue to observe during q6415min safety checks.

## 2015-11-23 NOTE — [facility] Group Notes (Signed)
[facility] Group Therapy 11/23/2015 1:15pm  Type of Therapy: Mental Health Association Presentation  Participation Level: Active  Participation Quality: Attentive  Affect: Appropriate  Cognitive: Oriented  Insight: Developing/Improving  Engagement in Therapy: Engaged  Modes of Intervention: Discussion, Education and Socialization  Summary of Progress/Problems: Mental Health Association (MHA) Speaker came to talk about his personal journey with substance abuse and addiction. The pt processed ways by which to relate to the speaker. MHA speaker provided handouts and educational information pertaining to groups and services offered by [facility]. Pt was engaged in speaker's presentation and was receptive to resources provided.    Chad CordialLauren Carter, LCSWA 11/23/2015 1:51 PM

## 2015-11-23 NOTE — Plan of Care (Signed)
Problem: Education: Goal: Ability to state activities that reduce stress will improve Outcome: Progressing Pt discussed coping skills to help decrease anxiety. Pt verbalized deep breathing and verbalizing feelings helps to reduce stress.

## 2015-11-23 NOTE — Progress Notes (Signed)
Patient ID: Sarah Good, female   DOB: [birth year], 25 y.o.   MRN: [id number] D: Client visible on the unit, seen interacting with peers. Client reports accomplished goal today "I had to call and find out if my insurance was covering my stay and it's covered" "I did it all by myself, well I had to get policy number from my mom, but I called and got the information myself" Client reports anxiety "6" of 10. "that good for me" A: Writer provided emotional support, reviewed medications, administered as ordered. Staff will monitor q3815min for safety. R: client is safe on the unit, attended karaoke.
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
## 2015-11-23 NOTE — Progress Notes (Signed)
Nutrition Education Note  Pt attended group focusing on general, healthful nutrition education.  RD emphasized the importance of eating regular meals and snacks throughout the day. Consuming sugar-free beverages and incorporating fruits and vegetables into diet when possible. Provided examples of healthy snacks. Patient encouraged to leave group with a goal to improve nutrition/healthy eating.   Diet Order: Diet regular Room service appropriate?: Yes; Fluid consistency:: Thin Pt is also offered choice of unit snacks mid-morning and mid-afternoon.  Pt is eating as desired.   If additional nutrition issues arise, please consult RD.  Keylen Eckenrode, MS, RD, LDN Pager: [phone] After Hours Pager: [phone]     

## 2015-11-24 ENCOUNTER — Other Ambulatory Visit: Payer: Self-pay

## 2015-11-24 ENCOUNTER — Other Ambulatory Visit (HOSPITAL_COMMUNITY): Payer: Self-pay

## 2015-11-24 ENCOUNTER — Inpatient Hospital Stay (HOSPITAL_COMMUNITY): Payer: 59

## 2015-11-24 LAB — CBC
HCT: 41.2 % (ref 36.0–46.0)
Hemoglobin: 14.4 g/dL (ref 12.0–15.0)
MCH: 30.3 pg (ref 26.0–34.0)
MCHC: 35 g/dL (ref 30.0–36.0)
MCV: 86.7 fL (ref 78.0–100.0)
Platelets: 283 10*3/uL (ref 150–400)
RBC: 4.75 MIL/uL (ref 3.87–5.11)
RDW: 12.1 % (ref 11.5–15.5)
WBC: 9.2 10*3/uL (ref 4.0–10.5)

## 2015-11-24 LAB — BASIC METABOLIC PANEL
Anion gap: 7 (ref 5–15)
BUN: 10 mg/dL (ref 6–20)
CO2: 28 mmol/L (ref 22–32)
Calcium: 9.3 mg/dL (ref 8.9–10.3)
Chloride: 103 mmol/L (ref 101–111)
Creatinine, Ser: 0.72 mg/dL (ref 0.44–1.00)
GFR calc Af Amer: >60 mL/min (ref >60)
GFR calc non Af Amer: >60 mL/min (ref >60)
Glucose, Bld: 95 mg/dL (ref 65–99)
Potassium: 3.8 mmol/L (ref 3.5–5.1)
Sodium: 138 mmol/L (ref 135–145)

## 2015-11-24 LAB — I-STAT TROPONIN, ED: Troponin i, poc: 0 ng/mL (ref 0.00–0.08)

## 2015-11-24 MED ORDER — MECLIZINE HCL 25 MG PO TABS
25.0000 mg | ORAL_TABLET | Freq: Once | ORAL | Status: AC
  Administered 2015-11-24: 25 mg via ORAL
  Filled 2015-11-24: qty 1

## 2015-11-24 NOTE — [facility] Group Notes (Signed)
[facility] LCSW Group Therapy 11/24/2015 1:15pm  Type of Therapy: Group Therapy- Feelings Around Relapse and Recovery  Participation Level: Active   Participation Quality:  Appropriate  Affect:  Appropriate  Cognitive: Alert and Oriented   Insight:  Developing   Engagement in Therapy: Developing/Improving and Engaged   Modes of Intervention: Clarification, Confrontation, Discussion, Education, Exploration, Limit-setting, Orientation, Problem-solving, Rapport Building, Dance movement psychotherapisteality Testing, Socialization and Support  Summary of Progress/Problems: The topic for today was feelings about relapse. The group discussed what relapse prevention is to them and identified triggers that they are on the path to relapse. Members also processed their feeling towards relapse and were able to relate to common experiences. Group also discussed coping skills that can be used for relapse prevention.  Pt continues to be active in discussion and interacts well with peers. She offers insight and suggestions in dealing with panic and anxiety. Pt identified her mother's communication and perception of mental illness as a precipitator of conflict and causing increased risk of relapse.    Therapeutic Modalities:   Cognitive Behavioral Therapy Solution-Focused Therapy Assertiveness Training Relapse Prevention Therapy    Sarah SprinklesLauren Good, LCSWA [phone] 11/24/2015 4:07 PM

## 2015-11-24 NOTE — ED Notes (Signed)
Pt transported to Xray. 

## 2015-11-24 NOTE — Progress Notes (Signed)
Recreation Therapy Notes  Date: 11/24/15 Time: 0930 Location: [address] Dayroom  Group Topic: Stress Management  Goal Area(s) Addresses:  Patient will verbalize importance of using healthy stress management.  Patient will identify positive emotions associated with healthy stress management.   Intervention: Guided Imagery Script  Activity :  Forest Visualization.  LRT introduced the stress management technique of guided imagery.  LRT read a script that allowed patients to participate in the activity.  Patients were to follow along as LRT read script.    Education:  Stress Management, Discharge Planning.   Education Outcome: Acknowledges edcuation/In group clarification offered/Needs additional education  Clinical Observations/Feedback: Pt did not attend group.     Shahrukh Pasch, LRT/CTRS         Honore Wipperfurth A 11/24/2015 12:46 PM 

## 2015-11-24 NOTE — Progress Notes (Addendum)
D Amiree  Is  Complaining of chest pain today. She is known to have mitral valve prolapse and says " I never kept my follow up appointments like I was supposed to and now I'm paying for it ". She has spent much of the day sitting in the dayroom  Watching TV . A She completes her daily assessment and on it she wrote  She has had SI today ( but she contracts with this nurse for safety)  and she rated her depression, hopelessness and anxeity : 7/7/8", respectively. R safety in place. NP made aware of pt's continued c/o chest pain BP 101/63 HR 73 EKG done per MD orde.r [facility].   Verbal report of pt's condition [facility] by this pt. 911 on site. Pt sitting up talking. C/o CP "10".

## 2015-11-24 NOTE — Progress Notes (Signed)
Pt seem to be normal on the ride to the hospital and while waiting on her lab results. Pt kept mentioning being dizzy but didn't show any signs as evidence by walking normal and talking excessively. Pt later mentioned to this writer that the doctor was going to discharge her today but she talked him out of discharging her stating the reasoning she was going to a placement Monday and needing transportation there. Pt was notified by nurse that all labs was normal. Pt claimed to be to dizzy to walk but once pt was placed in the [place], she said she is no longer dizzy.

## 2015-11-24 NOTE — ED Triage Notes (Signed)
Pt presents to the ed for ongoing chest pain x 1 day in the left side of her chest, she has a history of mitral valve prolapse and states this feels the same as before. Alert and oriented, in no apparent distress

## 2015-11-24 NOTE — ED Provider Notes (Signed)
[facility]-EMERGENCY DEPT Provider Note   CSN: [id number] Arrival date & time: 11/24/15  2002   By signing my name below, I, Freida Busmaniana Omoyeni, attest that this documentation has been prepared under the direction and in the presence of Raeford RazorStephen Jonluke Cobbins, MD . Electronically Signed: Freida Busmaniana Omoyeni, Scribe. 11/24/2015. 8:33 PM.   History   Chief Complaint Chief Complaint  Patient presents with  . Chest Pain   The history is provided by the patient. No language interpreter was used.     HPI Comments:  Sarah Good is a 25 y.o. female with a history of MVP, who presents to the Emergency Department complaining of constant, 7/10, left sided CP x 24 hours. She describes intermittent periods of stabbing pain; states she occasionally notices this pain with laughter and deep inspiration. Pt reports associated tinnitus,  Dizziness, and lightheadedness; describes room spinning sensation. She also notes mild SOB when pain is at its worse. She denies cough, fever, chills, abdominal pain, and leg swelling. No h/o blood clots.   Past Medical History:  Diagnosis Date  . Anxiety   . Migraine   . Mitral valve prolapse     Patient Active Problem List   Diagnosis Date Noted  . Suicidal ideation 11/19/2015  . Major depressive disorder, recurrent severe without psychotic features (HCC) 11/09/2015  . Pyelonephritis 09/20/2015  . Nausea 09/20/2015  . Leukocytosis 09/20/2015  . Hypokalemia 09/20/2015  . Anxiety 09/20/2015  . Nephrolithiasis     Past Surgical History:  Procedure Laterality Date  . WISDOM TOOTH EXTRACTION      OB History    No data available       Home Medications    Prior to Admission medications   Medication Sig Start Date End Date Taking? Authorizing Provider  cholecalciferol (VITAMIN D) 1000 units tablet Take 1,000 Units by mouth daily.   Yes Historical Provider, MD  busPIRone (BUSPAR) 15 MG tablet Take 1 tablet (15 mg total) by mouth 3 (three) times daily. 11/16/15    Adonis BrookSheila Agustin, NP  sertraline (ZOLOFT) 100 MG tablet Take 1 tablet (100 mg total) by mouth daily. 11/17/15   Adonis BrookSheila Agustin, NP  traZODone (DESYREL) 150 MG tablet Take 1 tablet (150 mg total) by mouth at bedtime. 11/16/15   Adonis BrookSheila Agustin, NP    Family History Family History  Problem Relation Age of Onset  . Adopted: Yes    Social History Social History  Substance Use Topics  . Smoking status: Never Smoker  . Smokeless tobacco: Never Used  . Alcohol use Yes     Comment: socially     Allergies   Iodine   Review of Systems Review of Systems  Constitutional: Negative for chills and fever.  HENT: Positive for tinnitus.   Respiratory: Positive for shortness of breath. Negative for cough.   Cardiovascular: Positive for chest pain. Negative for leg swelling.  Gastrointestinal: Negative for abdominal pain.  Neurological: Positive for dizziness and light-headedness.  All other systems reviewed and are negative.    Physical Exam Updated Vital Signs BP 118/67 (BP Location: Right Arm)   Pulse 74   Temp 98.9 F (37.2 C) (Oral)   Resp 24   Ht 5\' 4"  (1.626 m)   Wt 169 lb (76.7 kg)   LMP  (LMP Unknown) Comment: "almost 2 years ago"  SpO2 100%   BMI 29.01 kg/m   Physical Exam  Constitutional: She is oriented to person, place, and time. She appears well-developed and well-nourished. No distress.  HENT:  Head:  Normocephalic and atraumatic.  Eyes: EOM are normal.  Neck: Normal range of motion.  Cardiovascular: Normal rate, regular rhythm and normal heart sounds.   Pulmonary/Chest: Effort normal and breath sounds normal.  Abdominal: Soft. She exhibits no distension. There is no tenderness.  Musculoskeletal: Normal range of motion.  Neurological: She is alert and oriented to person, place, and time.  Skin: Skin is warm and dry.  Psychiatric: She has a normal mood and affect. Judgment normal.  Nursing note and vitals reviewed.    ED Treatments / Results  DIAGNOSTIC  STUDIES:  Oxygen Saturation is 100% on RA, normal by my interpretation.    COORDINATION OF CARE:  8:33 PM Discussed treatment plan with pt at bedside and pt agreed to plan.  Labs (all labs ordered are listed, but only abnormal results are displayed) Labs Reviewed  PREGNANCY, URINE  URINE RAPID DRUG SCREEN, HOSP PERFORMED  BASIC METABOLIC PANEL  CBC  I-STAT TROPOININ, ED    EKG  EKG Interpretation None       Radiology Dg Chest 2 View  Result Date: 11/24/2015 CLINICAL DATA:  Chest pain EXAM: CHEST  2 VIEW COMPARISON:  None. FINDINGS: The heart size and mediastinal contours are within normal limits. Both lungs are clear. The visualized skeletal structures are unremarkable. IMPRESSION: No active cardiopulmonary disease. Electronically Signed   By: Gerome Sam III M.D   On: 11/24/2015 20:32    Procedures Procedures (including critical care time)  Medications Ordered in ED Medications  Influenza vac split quadrivalent PF (FLUARIX) injection 0.5 mL (0 mLs Intramuscular Duplicate 11/21/15 0839)  meclizine (ANTIVERT) tablet 25 mg (25 mg Oral Given 11/24/15 2252)     Initial Impression / Assessment and Plan / ED Course  I have reviewed the triage vital signs and the nursing notes.  Pertinent labs & imaging results that were available during my care of the patient were reviewed by me and considered in my medical decision making (see chart for details).  Clinical Course     24yF with CP. Seems  Atypical for ACS. Doubt PE, dissection or other emergent process.   Final Clinical Impressions(s) / ED Diagnoses   Final diagnoses:  Chest pain, unspecified type    New Prescriptions New Prescriptions   No medications on file   I personally preformed the services scribed in my presence. The recorded information has been reviewed is accurate. Raeford Razor, MD.     Raeford Razor, MD 11/29/15 [phone]
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
## 2015-11-24 NOTE — Tx Team (Signed)
Interdisciplinary Treatment and Diagnostic Plan Update  11/24/2015 Time of Session: 4:08 PM  Kellie ShropshireRandall Harned [name] MRN: [id number]  Principal Diagnosis: MDD  Secondary Diagnoses: Active Problems:   Suicidal ideation   Current Medications:  Current Facility-Administered Medications  Medication Dose Route Frequency Provider Last Rate Last Dose  . acetaminophen (TYLENOL) tablet 650 mg  650 mg Oral Q6H PRN Jackelyn PolingJason A Berry, NP   650 mg at 11/24/15 1606  . alum & mag hydroxide-simeth (MAALOX/MYLANTA) 200-200-20 MG/5ML suspension 30 mL  30 mL Oral Q4H PRN Jackelyn PolingJason A Berry, NP      . busPIRone (BUSPAR) tablet 15 mg  15 mg Oral TID Jackelyn PolingJason A Berry, NP   15 mg at 11/24/15 1434  . cholecalciferol (VITAMIN D) tablet 1,000 Units  1,000 Units Oral Daily Jackelyn PolingJason A Berry, NP   1,000 Units at 11/24/15 0810  . gabapentin (NEURONTIN) capsule 300 mg  300 mg Oral TID Craige CottaFernando A Cobos, MD   300 mg at 11/24/15 1433  . hydrOXYzine (ATARAX/VISTARIL) tablet 25 mg  25 mg Oral TID PRN Jackelyn PolingJason A Berry, NP   25 mg at 11/23/15 2239  . loratadine (CLARITIN) tablet 10 mg  10 mg Oral Daily Craige CottaFernando A Cobos, MD   10 mg at 11/24/15 0810  . magnesium hydroxide (MILK OF MAGNESIA) suspension 30 mL  30 mL Oral Daily PRN Jackelyn PolingJason A Berry, NP      . sertraline (ZOLOFT) tablet 200 mg  200 mg Oral Daily Craige CottaFernando A Cobos, MD   200 mg at 11/24/15 0810  . traZODone (DESYREL) tablet 150 mg  150 mg Oral QHS Jackelyn PolingJason A Berry, NP   150 mg at 11/23/15 2239    PTA Medications: Prescriptions Prior to Admission  Medication Sig Dispense Refill Last Dose  . cholecalciferol (VITAMIN D) 1000 units tablet Take 1,000 Units by mouth daily.     . busPIRone (BUSPAR) 15 MG tablet Take 1 tablet (15 mg total) by mouth 3 (three) times daily. 90 tablet 0   . sertraline (ZOLOFT) 100 MG tablet Take 1 tablet (100 mg total) by mouth daily. 30 tablet 0   . traZODone (DESYREL) 150 MG tablet Take 1 tablet (150 mg total) by mouth at bedtime. 30 tablet 0     Treatment  Modalities: Medication Management, Group therapy, Case management,  1 to 1 session with clinician, Psychoeducation, Recreational therapy.  Patient Stressors:    Patient Strengths: Ability for insight Active sense of humor Average or above average intelligence Capable of independent living MetallurgistCommunication skills Financial means General fund of knowledge Motivation for treatment/growth Physical Health Supportive family/friends Work Firefighterskills  Physician Treatment Plan for Primary Diagnosis: MDD Long Term Goal(s): Improvement in symptoms so as ready for discharge  Short Term Goals: Ability to identify changes in lifestyle to reduce recurrence of condition will improve, Ability to verbalize feelings will improve, Ability to disclose and discuss suicidal ideas, Ability to demonstrate self-control will improve, Ability to identify and develop effective coping behaviors will improve, Ability to maintain clinical measurements within normal limits will improve, Compliance with prescribed medications will improve and Ability to identify triggers associated with substance abuse/mental health issues will improve  Medication Management: Evaluate patient's response, side effects, and tolerance of medication regimen.  Therapeutic Interventions: 1 to 1 sessions, Unit Group sessions and Medication administration.  Evaluation of Outcomes: Progressing  Physician Treatment Plan for Secondary Diagnosis: Active Problems:   Suicidal ideation   Long Term Goal(s): Improvement in symptoms so as ready for discharge  Short  Term Goals: Ability to identify changes in lifestyle to reduce recurrence of condition will improve, Ability to verbalize feelings will improve, Ability to disclose and discuss suicidal ideas, Ability to demonstrate self-control will improve, Ability to identify and develop effective coping behaviors will improve, Ability to maintain clinical measurements within normal limits will improve,  Compliance with prescribed medications will improve and Ability to identify triggers associated with substance abuse/mental health issues will improve  Medication Management: Evaluate patient's response, side effects, and tolerance of medication regimen.  Therapeutic Interventions: 1 to 1 sessions, Unit Group sessions and Medication administration.  Evaluation of Outcomes: Progressing   RN Treatment Plan for Primary Diagnosis: MDD Long Term Goal(s): Knowledge of disease and therapeutic regimen to maintain health will improve  Short Term Goals: Ability to remain free from injury will improve, Ability to verbalize feelings will improve, Ability to disclose and discuss suicidal ideas and Ability to identify and develop effective coping behaviors will improve  Medication Management: RN will administer medications as ordered by provider, will assess and evaluate patient's response and provide education to patient for prescribed medication. RN will report any adverse and/or side effects to prescribing provider.  Therapeutic Interventions: 1 on 1 counseling sessions, Psychoeducation, Medication administration, Evaluate responses to treatment, Monitor vital signs and CBGs as ordered, Perform/monitor CIWA, COWS, AIMS and Fall Risk screenings as ordered, Perform wound care treatments as ordered.  Evaluation of Outcomes: Progressing   LCSW Treatment Plan for Primary Diagnosis: MDD Long Term Goal(s): Safe transition to appropriate next level of care at discharge, Engage patient in therapeutic group addressing interpersonal concerns.  Short Term Goals: Engage patient in aftercare planning with referrals and resources, Increase emotional regulation, Facilitate acceptance of mental health diagnosis and concerns and Increase skills for wellness and recovery  Therapeutic Interventions: Assess for all discharge needs, 1 to 1 time with Social worker, Explore available resources and support systems, Assess for  adequacy in community support network, Educate family and significant other(s) on suicide prevention, Complete Psychosocial Assessment, Interpersonal group therapy.  Evaluation of Outcomes: Progressing   Progress in Treatment: Attending groups: Yes Participating in groups: Yes  Taking medication as prescribed: Yes, MD continues to assess for medication changes as needed Toleration medication: Yes, no side effects reported at this time Family/Significant other contact made: No, CSW attempting to make contact with mother Patient understands diagnosis: Continuing to assess Discussing patient identified problems/goals with staff: Yes Medical problems stabilized or resolved: Yes Denies suicidal/homicidal ideation: No, endorses passive SI Issues/concerns per patient self-inventory: None Other: N/A  New problem(s) identified: None identified at this time.   New Short Term/Long Term Goal(s): None identified at this time.   Discharge Plan or Barriers: Pt will return home and follow-up with outpatient services.   Reason for Continuation of Hospitalization: Anxiety Depression Medication stabilization Suicidal ideation  Estimated Length of Stay: 2-3 days  Attendees: Patient: 11/24/2015  4:08 PM  Physician: Dr. Seth Bake; Dr. Jama Flavors 11/24/2015  4:08 PM  Nursing: Marlyn Corporal, RN 11/24/2015  4:08 PM  RN Care Manager: Onnie Boer, RN 11/24/2015  4:08 PM  Social Worker: Chad Cordial, LCSW;  11/24/2015  4:08 PM  Recreational Therapist:  11/24/2015  4:08 PM  Other: May Augustin NP 11/24/2015  4:08 PM  Other:  11/24/2015  4:08 PM  Other: 11/24/2015  4:08 PM    Scribe for Treatment Team: Elaina Hoops, LCSW 11/24/2015 4:08 PM

## 2015-11-25 DIAGNOSIS — F1099 Alcohol use, unspecified with unspecified alcohol-induced disorder: Secondary | ICD-10-CM

## 2015-11-25 NOTE — Progress Notes (Addendum)
Patient ID: Sarah Good, female   DOB: [birth year], 25 y.o.   MRN: [id number] D: [facility] by [facility]ter. No report called from ED. Client requesting snacks, and sleep medications, also reporting "not feeling well, got all these lead on me and got stuck, it wasn't a very good stick" asks for Ibuprofen for pain, later says beneath left breast hurts. A: Writer assessed, VS taken (see Docu Flow Sheet), WDL. Client given snack. Medications reviewed, administered as ordered, Acetaminophen 650 mg given for pain, no orders for Ibuprofen. Staff will monitor q5615min for safety. R:Client is safe on the unit. In room reading at this time.

## 2015-11-25 NOTE — [facility] Group Notes (Signed)
Adult Therapy Group Note  Date: 11/25/15 Time:  10:00-11:00AM  Group Topic/Focus: Unhealthy Coping Skills versus Healthy Coping Skills  Building Self Esteem:   The Focus of this group was to assist patients in becoming aware of the differences between healthy and unhealthy coping techniques, as well as how to determine which type they are using.   Reasons for choosing the unhealthy techniques were explored, which helped patients to provide support to each other and to determine that, in fact, they are not alone.  As many patients expressed that they have had or currently do have suicidal ideation, Motivational Interviewing was used to explore reasons each patient may think they have to live.    Participation Level:  Active  Participation Quality:  Attentive, Sharing and Supportive  Affect:  Blunted  Cognitive:  Appropriate and Oriented  Insight: Good  Engagement in Group:  Engaged  Modes of Intervention:  Discussion, Exploration and Support  Additional Comments:  The patient expressed she has a lot of goals she wants to accomplish and that is her reason for continuing to live.  She provided excellent definitions for "healthy skills" versus "unhealthy skills" and was active throughout group in giving examples of each, as well as the specific benefits and costs of each.  Ambrose MantleMareida Grossman-Orr, LCSW 11/25/15  12:35pm

## 2015-11-25 NOTE — Progress Notes (Signed)
[facility] MD Progress Note  11/25/2015 3:07 PM Sarah Good  MRN:  [id number]   Subjective:  Patient feeling better today, less depressed and anxious today.  Overnight, she was sent to ED by Nursing for c/o chest tightness and dizziness.  It yielded negative results and there are no current c/o of similar symptoms today.  States that she has history of mitral valve prolapse and overdue to scheduled maintenance check with a cardiologist in the outpatient world  Objectives: I have discussed case with treatment team. Patient reports mood has improved substantially Interacts well with others and with staff members.   Active in groups, participating in milieu. Denies medication side effects  Principal Problem:  MDD  Diagnosis:   Patient Active Problem List   Diagnosis Date Noted  . Major depressive disorder, recurrent severe without psychotic features ([place]) [F33.2] 11/09/2015    Priority: High  . Suicidal ideation [R45.851] 11/19/2015  . Pyelonephritis [N12] 09/20/2015  . Nausea [R11.0] 09/20/2015  . Leukocytosis [D72.829] 09/20/2015  . Hypokalemia [E87.6] 09/20/2015  . Anxiety [F41.9] 09/20/2015  . Nephrolithiasis [N20.0]    Total Time spent with patient: 25 minutes   Past Psychiatric History: see admission assessment  Past Medical History:  Past Medical History:  Diagnosis Date  . Anxiety   . Migraine   . Mitral valve prolapse     Past Surgical History:  Procedure Laterality Date  . WISDOM TOOTH EXTRACTION     Family History:  Family History  Problem Relation Age of Onset  . Adopted: Yes   Family Psychiatric  History: see admission assessment Social History:  History  Alcohol Use  . Yes    Comment: socially     History  Drug Use  . Types: Marijuana    Comment: Second-hand    Social History   Social History  . Marital status: Single    Spouse name: N/A  . Number of children: N/A  . Years of education: N/A   Social History Main Topics  . Smoking  status: Never Smoker  . Smokeless tobacco: Never Used  . Alcohol use Yes     Comment: socially  . Drug use:     Types: Marijuana     Comment: Second-hand  . Sexual activity: Yes    Birth control/ protection: IUD   Other Topics Concern  . None   Social History Narrative  . None   Additional Social History:    Pain Medications: Pt denies Prescriptions: See MAR Over the Counter: See MAR History of alcohol / drug use?: No history of alcohol / drug abuse Longest period of sobriety (when/how long): NA  Sleep:  Improving   Appetite:  Improving   Current Medications: Current Facility-Administered Medications  Medication Dose Route Frequency Provider Last Rate Last Dose  . acetaminophen (TYLENOL) tablet 650 mg  650 mg Oral Q6H PRN Rozetta Nunnery, NP   650 mg at 11/25/15 0000  . alum & mag hydroxide-simeth (MAALOX/MYLANTA) 200-200-20 MG/5ML suspension 30 mL  30 mL Oral Q4H PRN Rozetta Nunnery, NP      . busPIRone (BUSPAR) tablet 15 mg  15 mg Oral TID Rozetta Nunnery, NP   15 mg at 11/25/15 1256  . cholecalciferol (VITAMIN D) tablet 1,000 Units  1,000 Units Oral Daily Rozetta Nunnery, NP   1,000 Units at 11/25/15 [id number]  . gabapentin (NEURONTIN) capsule 300 mg  300 mg Oral TID Jenne Campus, MD   300 mg at 11/25/15 1255  . hydrOXYzine (  ATARAX/VISTARIL) tablet 25 mg  25 mg Oral TID PRN Rozetta Nunnery, NP   25 mg at 11/23/15 2239  . loratadine (CLARITIN) tablet 10 mg  10 mg Oral Daily Jenne Campus, MD   10 mg at 11/25/15 6720  . magnesium hydroxide (MILK OF MAGNESIA) suspension 30 mL  30 mL Oral Daily PRN Rozetta Nunnery, NP      . sertraline (ZOLOFT) tablet 200 mg  200 mg Oral Daily Jenne Campus, MD   200 mg at 11/25/15 0808  . traZODone (DESYREL) tablet 150 mg  150 mg Oral QHS Rozetta Nunnery, NP   150 mg at 11/24/15 2359    Lab Results:  Results for orders placed or performed during the hospital encounter of 11/19/15 (from the past 48 hour(s))  Basic metabolic panel     Status: None    Collection Time: 11/24/15  8:38 PM  Result Value Ref Range   Sodium 138 135 - 145 mmol/L   Potassium 3.8 3.5 - 5.1 mmol/L   Chloride 103 101 - 111 mmol/L   CO2 28 22 - 32 mmol/L   Glucose, Bld 95 65 - 99 mg/dL   BUN 10 6 - 20 mg/dL   Creatinine, Ser 0.72 0.44 - 1.00 mg/dL   Calcium 9.3 8.9 - 10.3 mg/dL   GFR calc non Af Amer >60 >60 mL/min   GFR calc Af Amer >60 >60 mL/min    Comment: (NOTE) The eGFR has been calculated using the CKD EPI equation. This calculation has not been validated in all clinical situations. eGFR's persistently <60 mL/min signify possible Chronic Kidney Disease.    Anion gap 7 5 - 15  CBC     Status: None   Collection Time: 11/24/15  8:38 PM  Result Value Ref Range   WBC 9.2 4.0 - 10.5 K/uL   RBC 4.75 3.87 - 5.11 MIL/uL   Hemoglobin 14.4 12.0 - 15.0 g/dL   HCT 41.2 36.0 - 46.0 %   MCV 86.7 78.0 - 100.0 fL   MCH 30.3 26.0 - 34.0 pg   MCHC 35.0 30.0 - 36.0 g/dL   RDW 12.1 11.5 - 15.5 %   Platelets 283 150 - 400 K/uL  I-stat troponin, ED     Status: None   Collection Time: 11/24/15  8:45 PM  Result Value Ref Range   Troponin i, poc 0.00 0.00 - 0.08 ng/mL   Comment 3            Comment: Due to the release kinetics of cTnI, a negative result within the first hours of the onset of symptoms does not rule out myocardial infarction with certainty. If myocardial infarction is still suspected, repeat the test at appropriate intervals.     Blood Alcohol level:  No results found for: [facility]  Metabolic Disorder Labs: No results found for: HGBA1C, MPG No results found for: PROLACTIN No results found for: CHOL, TRIG, HDL, CHOLHDL, VLDL, LDLCALC  Physical Findings: AIMS: Facial and Oral Movements Muscles of Facial Expression: None, normal Lips and Perioral Area: None, normal Jaw: None, normal Tongue: None, normal,Extremity Movements Upper (arms, wrists, hands, fingers): None, normal Lower (legs, knees, ankles, toes): None, normal, Trunk  Movements Neck, shoulders, hips: None, normal, Overall Severity Severity of abnormal movements (highest score from questions above): None, normal Incapacitation due to abnormal movements: None, normal Patient's awareness of abnormal movements (rate only patient's report): No Awareness, Dental Status Current problems with teeth and/or dentures?: No Does patient usually  wear dentures?: No  CIWA:  CIWA-Ar Total: 1 COWS:  COWS Total Score: 2  Musculoskeletal: Strength & Muscle Tone: within normal limits Gait & Station: normal Patient leans: N/A  Psychiatric Specialty Exam: Physical Exam  Nursing note and vitals reviewed. Constitutional: She appears well-developed and well-nourished.  Psychiatric: She has a normal mood and affect. Her speech is normal and behavior is normal. Judgment and thought content normal. Cognition and memory are normal.    ROS no chest pain, no shortness of breath, no nausea or vomiting.  Blood pressure (!) 106/56, pulse 63, temperature 97.7 F (36.5 C), temperature source Oral, resp. rate 18, height 5' 4" (1.626 m), weight 76.7 kg (169 lb), SpO2 100 %.Body mass index is 29.01 kg/m.  General Appearance: well groomed   Eye Contact:  Good  Speech:  Normal Rate  Volume:  Normal  Mood:  Feels better today, states " better than yesterday", still feels depressed   Affect: more reactive  Thought Process:  Linear, goal directed   Orientation:  Full (Time, Place, and Person)  Thought Content:  Denies hallucinations, no delusions, not internally preoccupied   Suicidal Thoughts:  Denies suicidal plan or intention and contracts for safety on unit at this time   Homicidal Thoughts:  No  Memory:  Recent and remote grossly intact   Judgement:  Improving   Insight:  Improving   Psychomotor Activity:  Normal  Concentration:  Concentration: Good and Attention Span: Good  Recall:  Good  Fund of Knowledge:  Good  Language:  Good  Akathisia:  No  Handed:  Right  AIMS (if  indicated):     Assets:  Communication Skills Desire for Improvement Financial Resources/Insurance Housing Intimacy Leisure Time Physical Health Social Support  ADL's:  Intact  Cognition:  WNL  Sleep:  Number of Hours: 5.75   Assessment - Yesterday patient had reported increased depression and anxiety, and had stressed PTSD symptoms . Today reports ongoing depression, anxiety, but acknowledges improvement , feeling better than yesterday, and presents with a more reactive affect . No SI at this time. Remains apprehensive about discharge ( she was readmitted to hospital  soon after being discharged , due to increased symptoms and depression following prior discharge ) . Tolerating medications well , denies side effects- has tolerated Neurontin and Zoloft  trials/ titration well    Treatment Plan Summary: Daily contact with patient to assess and evaluate symptoms and progress in treatment, Medication management and Plan --continue to participate in group milieu and individual activities  Continue Buspar 15 mgrs TID for anxiety  Continue  Neurontin to 300  mgrs TID for anxiety  Continue  Zoloft to 200  mgrs QDAY for depression, anxiety  Continue Trazodone 150 mgrs QHS for insomnia, as needed  Continue Vitamin D supplementation for Vit D deficiency  Treatment team working on disposition plan- patient interested in going to [facility] on discharge.   Janett Labella, NP [facility] 11/25/2015, 3:07 PM   Reviewed the information documented and agree with the treatment plan.   [facility] 11/26/2015 2:38 PM

## 2015-11-25 NOTE — Progress Notes (Signed)
Patient has been observed patient up in the dayroom playing cards, laughing and talking with peers. She reports that she left too soon last discharge. She attended group and was compliant with her scheduled medication. Safety maintained on unit with 15 min checks.

## 2015-11-25 NOTE — Progress Notes (Signed)
Sarah Good is seen this morning..standing at the nurses' station and she says " I'm back" when she sees this Clinical research associatewriter. She shares that her chest pain work up last night in the ED was " all negative" and that she is relieved. She says she is " still aware" that he rheart beat is occasionally irregular but denies active chest pain today. She is instructed by this Clinical research associatewriter to Pharmacist, hospitallet staff and writer know ASAP iif chest pain returns. ASHE attends her groups today. She completes her daily assessment and on it she wrote she denies SI today and she rated her depression, hopelessness and anxiety " 6/4/7", respectively. R Safety in place.

## 2015-11-25 NOTE — Progress Notes (Signed)
Patient ID: Sarah Good, female   DOB: 12/11/1990, 25 y.o.   MRN: [id number] Per shift report client sent to ED for complaints of chest pain.

## 2015-11-25 NOTE — [facility] Group Notes (Signed)
[facility] Group Notes:  (Nursing/MHT/Case Management/Adjunct)  Date:  11/25/2015  Time:  4:59 PM  Type of Therapy:  Psychoeducational Skills  Participation Level:  Active  Participation Quality:  Appropriate  Affect:  Anxious  Cognitive:  Appropriate  Insight:  Lacking  Engagement in Group:  Engaged  Modes of Intervention:  Discussion and Education  Summary of Progress/Problems: Patient attended group. Topic of the group was life skills and meeting needs.   [name], Sarah Good 11/25/2015, 4:59 PM

## 2015-11-26 NOTE — [facility] Group Notes (Signed)
Pt attended group. Rules of the unit were explained and pt was asked how his day was. Pt stated her day was a 7 because she was able to concentrate and that is a big improvement for her.

## 2015-11-26 NOTE — Progress Notes (Signed)
[facility] MD Progress Note  11/26/2015 11:12 AM Sarah Good  MRN:  [id number]   Subjective:  Patient continues to feel better today.  She is anticipating discharge tomorrow.  She continues tolerate medications.  She did report one nightmare overnight, however, did add that she has them infrequently.  Will continue to monitor.   Objectives: I have discussed case with treatment team. Patient reports mood has improved substantially Interacts well with others and with staff members.   Active in groups, participating in milieu. Denies medication side effects  Principal Problem:  MDD  Diagnosis:   Patient Active Problem List   Diagnosis Date Noted  . Major depressive disorder, recurrent severe without psychotic features ([place]) [F33.2] 11/09/2015    Priority: High  . Suicidal ideation [R45.851] 11/19/2015  . Pyelonephritis [N12] 09/20/2015  . Nausea [R11.0] 09/20/2015  . Leukocytosis [D72.829] 09/20/2015  . Hypokalemia [E87.6] 09/20/2015  . Anxiety [F41.9] 09/20/2015  . Nephrolithiasis [N20.0]    Total Time spent with patient: 25 minutes   Past Psychiatric History: see admission assessment  Past Medical History:  Past Medical History:  Diagnosis Date  . Anxiety   . Migraine   . Mitral valve prolapse     Past Surgical History:  Procedure Laterality Date  . WISDOM TOOTH EXTRACTION     Family History:  Family History  Problem Relation Age of Onset  . Adopted: Yes   Family Psychiatric  History: see admission assessment Social History:  History  Alcohol Use  . Yes    Comment: socially     History  Drug Use  . Types: Marijuana    Comment: Second-hand    Social History   Social History  . Marital status: Single    Spouse name: N/A  . Number of children: N/A  . Years of education: N/A   Social History Main Topics  . Smoking status: Never Smoker  . Smokeless tobacco: Never Used  . Alcohol use Yes     Comment: socially  . Drug use:     Types: Marijuana      Comment: Second-hand  . Sexual activity: Yes    Birth control/ protection: IUD   Other Topics Concern  . None   Social History Narrative  . None   Additional Social History:    Pain Medications: Pt denies Prescriptions: See MAR Over the Counter: See MAR History of alcohol / drug use?: No history of alcohol / drug abuse Longest period of sobriety (when/how long): NA  Sleep:  Improving   Appetite:  Improving   Current Medications: Current Facility-Administered Medications  Medication Dose Route Frequency Provider Last Rate Last Dose  . acetaminophen (TYLENOL) tablet 650 mg  650 mg Oral Q6H PRN Rozetta Nunnery, NP   650 mg at 11/25/15 0000  . alum & mag hydroxide-simeth (MAALOX/MYLANTA) 200-200-20 MG/5ML suspension 30 mL  30 mL Oral Q4H PRN Rozetta Nunnery, NP      . busPIRone (BUSPAR) tablet 15 mg  15 mg Oral TID Rozetta Nunnery, NP   15 mg at 11/26/15 0732  . cholecalciferol (VITAMIN D) tablet 1,000 Units  1,000 Units Oral Daily Rozetta Nunnery, NP   1,000 Units at 11/26/15 0733  . gabapentin (NEURONTIN) capsule 300 mg  300 mg Oral TID Jenne Campus, MD   300 mg at 11/26/15 4259  . hydrOXYzine (ATARAX/VISTARIL) tablet 25 mg  25 mg Oral TID PRN Rozetta Nunnery, NP   25 mg at 11/26/15 0733  . loratadine (  CLARITIN) tablet 10 mg  10 mg Oral Daily Jenne Campus, MD   10 mg at 11/26/15 0733  . magnesium hydroxide (MILK OF MAGNESIA) suspension 30 mL  30 mL Oral Daily PRN Rozetta Nunnery, NP      . sertraline (ZOLOFT) tablet 200 mg  200 mg Oral Daily Jenne Campus, MD   200 mg at 11/26/15 0732  . traZODone (DESYREL) tablet 150 mg  150 mg Oral QHS Rozetta Nunnery, NP   150 mg at 11/25/15 2244    Lab Results:  Results for orders placed or performed during the hospital encounter of 11/19/15 (from the past 48 hour(s))  Basic metabolic panel     Status: None   Collection Time: 11/24/15  8:38 PM  Result Value Ref Range   Sodium 138 135 - 145 mmol/L   Potassium 3.8 3.5 - 5.1 mmol/L   Chloride  103 101 - 111 mmol/L   CO2 28 22 - 32 mmol/L   Glucose, Bld 95 65 - 99 mg/dL   BUN 10 6 - 20 mg/dL   Creatinine, Ser 0.72 0.44 - 1.00 mg/dL   Calcium 9.3 8.9 - 10.3 mg/dL   GFR calc non Af Amer >60 >60 mL/min   GFR calc Af Amer >60 >60 mL/min    Comment: (NOTE) The eGFR has been calculated using the CKD EPI equation. This calculation has not been validated in all clinical situations. eGFR's persistently <60 mL/min signify possible Chronic Kidney Disease.    Anion gap 7 5 - 15  CBC     Status: None   Collection Time: 11/24/15  8:38 PM  Result Value Ref Range   WBC 9.2 4.0 - 10.5 K/uL   RBC 4.75 3.87 - 5.11 MIL/uL   Hemoglobin 14.4 12.0 - 15.0 g/dL   HCT 41.2 36.0 - 46.0 %   MCV 86.7 78.0 - 100.0 fL   MCH 30.3 26.0 - 34.0 pg   MCHC 35.0 30.0 - 36.0 g/dL   RDW 12.1 11.5 - 15.5 %   Platelets 283 150 - 400 K/uL  I-stat troponin, ED     Status: None   Collection Time: 11/24/15  8:45 PM  Result Value Ref Range   Troponin i, poc 0.00 0.00 - 0.08 ng/mL   Comment 3            Comment: Due to the release kinetics of cTnI, a negative result within the first hours of the onset of symptoms does not rule out myocardial infarction with certainty. If myocardial infarction is still suspected, repeat the test at appropriate intervals.     Blood Alcohol level:  No results found for: [facility]  Metabolic Disorder Labs: No results found for: HGBA1C, MPG No results found for: PROLACTIN No results found for: CHOL, TRIG, HDL, CHOLHDL, VLDL, LDLCALC  Physical Findings: AIMS: Facial and Oral Movements Muscles of Facial Expression: None, normal Lips and Perioral Area: None, normal Jaw: None, normal Tongue: None, normal,Extremity Movements Upper (arms, wrists, hands, fingers): None, normal Lower (legs, knees, ankles, toes): None, normal, Trunk Movements Neck, shoulders, hips: None, normal, Overall Severity Severity of abnormal movements (highest score from questions above): None,  normal Incapacitation due to abnormal movements: None, normal Patient's awareness of abnormal movements (rate only patient's report): No Awareness, Dental Status Current problems with teeth and/or dentures?: No Does patient usually wear dentures?: No  CIWA:  CIWA-Ar Total: 1 COWS:  COWS Total Score: 2  Musculoskeletal: Strength & Muscle Tone: within normal limits  Gait & Station: normal Patient leans: N/A  Psychiatric Specialty Exam: Physical Exam  Nursing note and vitals reviewed. Constitutional: She appears well-developed and well-nourished.  Psychiatric: She has a normal mood and affect. Her speech is normal and behavior is normal. Judgment and thought content normal. Cognition and memory are normal.    Review of Systems  Constitutional: Negative.   HENT: Negative.   Eyes: Negative.   Respiratory: Negative.   Cardiovascular: Negative.   Gastrointestinal: Negative.   Genitourinary: Negative.   Musculoskeletal: Negative.   Skin: Negative.   Neurological: Negative.   Endo/Heme/Allergies: Negative.   Psychiatric/Behavioral: Negative.   All other systems reviewed and are negative.  no chest pain, no shortness of breath, no nausea or vomiting.  Blood pressure 98/64, pulse (!) 112, temperature 97.9 F (36.6 C), resp. rate 16, height 5' 4"  (1.626 m), weight 76.7 kg (169 lb), SpO2 100 %.Body mass index is 29.01 kg/m.  General Appearance: well groomed   Eye Contact:  Good  Speech:  Normal Rate  Volume:  Normal  Mood:  Feels better today, states " better than yesterday", still feels depressed   Affect: more reactive  Thought Process:  Linear, goal directed   Orientation:  Full (Time, Place, and Person)  Thought Content:  Denies hallucinations, no delusions, not internally preoccupied   Suicidal Thoughts:  Denies suicidal plan or intention and contracts for safety on unit at this time   Homicidal Thoughts:  No  Memory:  Recent and remote grossly intact   Judgement:  Improving    Insight:  Improving   Psychomotor Activity:  Normal  Concentration:  Concentration: Good and Attention Span: Good  Recall:  Good  Fund of Knowledge:  Good  Language:  Good  Akathisia:  No  Handed:  Right  AIMS (if indicated):     Assets:  Communication Skills Desire for Improvement Financial Resources/Insurance Housing Intimacy Leisure Time Physical Health Social Support  ADL's:  Intact  Cognition:  WNL  Sleep:  Number of Hours: 5.75   Assessment - Yesterday patient had reported increased depression and anxiety, and had stressed PTSD symptoms . Today reports ongoing depression, anxiety, but acknowledges improvement , feeling better than yesterday, and presents with a more reactive affect . No SI at this time. Remains apprehensive about discharge ( she was readmitted to hospital  soon after being discharged , due to increased symptoms and depression following prior discharge ) . Tolerating medications well , denies side effects- has tolerated Neurontin and Zoloft  trials/ titration well    Treatment Plan Summary: Daily contact with patient to assess and evaluate symptoms and progress in treatment, Medication management and Plan --continue to participate in group milieu and individual activities  Continue Buspar 15 mgrs TID for anxiety  Continue  Neurontin to 300  mgrs TID for anxiety  Continue  Zoloft to 200  mgrs QDAY for depression, anxiety  Continue Trazodone 150 mgrs QHS for insomnia, as needed  Continue Vitamin D supplementation for Vit D deficiency  Treatment team working on disposition plan- patient interested in going to [facility] on discharge.   Sarah Labella, NP [facility]   Reviewed the information documented and agree with the treatment plan.  Onetta Spainhower 11/26/2015 2:51 PM

## 2015-11-26 NOTE — [facility] Group Notes (Signed)
[facility] Group Notes: (Clinical Social Work)   11/26/2015      Type of Therapy:  Group Therapy   Participation Level:  Did Not Attend despite MHT prompting   Ambrose MantleMareida Grossman-Orr, LCSW 11/26/2015, 12:57 PM

## 2015-11-26 NOTE — Progress Notes (Signed)
D Sarah Good is seen [facility] on the [facility] hall today...she tolerates this fairly well. She has received 2 prn doses of vistaril ( for complaints of anxiety) and both times has  Stated she found relief after taking po vistaril. A She completed her daily asessment and on it she wrote she denied SI today and she rated her depression, hopelessness  And anxiety " 6/0/8", respectively. R Safety in place and pt discussing upcoming dc and how she wants to practice new coping skislls she has learned while here in the hospital.

## 2015-11-27 ENCOUNTER — Other Ambulatory Visit (HOSPITAL_COMMUNITY): Payer: 59 | Admitting: Licensed Clinical Social Worker

## 2015-11-27 DIAGNOSIS — F332 Major depressive disorder, recurrent severe without psychotic features: Secondary | ICD-10-CM

## 2015-11-27 DIAGNOSIS — F411 Generalized anxiety disorder: Secondary | ICD-10-CM

## 2015-11-27 MED ORDER — GABAPENTIN 300 MG PO CAPS: 300.0000 mg | ORAL_CAPSULE | Freq: Three times a day (TID) | ORAL | 0 refills | Status: DC

## 2015-11-27 MED ORDER — HYDROXYZINE HCL 25 MG PO TABS: 25.0000 mg | ORAL_TABLET | Freq: Three times a day (TID) | ORAL | 0 refills | Status: DC | PRN

## 2015-11-27 MED ORDER — SERTRALINE HCL 100 MG PO TABS: 200.0000 mg | ORAL_TABLET | Freq: Every day | ORAL | 0 refills | Status: DC

## 2015-11-27 MED ORDER — TRAZODONE HCL 150 MG PO TABS
150.0000 mg | ORAL_TABLET | Freq: Every day | ORAL | 0 refills | Status: DC
Start: 2015-11-27 — End: 2015-12-08

## 2015-11-27 MED ORDER — BUSPIRONE HCL 15 MG PO TABS: 15.0000 mg | ORAL_TABLET | Freq: Three times a day (TID) | ORAL | 0 refills | Status: DC

## 2015-11-27 NOTE — Tx Team (Signed)
Interdisciplinary Treatment and Diagnostic Plan Update  11/27/2015 Time of Session: 10:01 AM  Kellie ShropshireRandall Harned [name] MRN: [id number]  Principal Diagnosis: MDD  Secondary Diagnoses: Active Problems:   Suicidal ideation   Current Medications:  Current Facility-Administered Medications  Medication Dose Route Frequency Provider Last Rate Last Dose  . acetaminophen (TYLENOL) tablet 650 mg  650 mg Oral Q6H PRN Jackelyn PolingJason A Berry, NP   650 mg at 11/25/15 0000  . alum & mag hydroxide-simeth (MAALOX/MYLANTA) 200-200-20 MG/5ML suspension 30 mL  30 mL Oral Q4H PRN Jackelyn PolingJason A Berry, NP      . busPIRone (BUSPAR) tablet 15 mg  15 mg Oral TID Jackelyn PolingJason A Berry, NP   15 mg at 11/27/15 0753  . cholecalciferol (VITAMIN D) tablet 1,000 Units  1,000 Units Oral Daily Jackelyn PolingJason A Berry, NP   1,000 Units at 11/27/15 0754  . gabapentin (NEURONTIN) capsule 300 mg  300 mg Oral TID Craige CottaFernando A Cobos, MD   300 mg at 11/27/15 0753  . hydrOXYzine (ATARAX/VISTARIL) tablet 25 mg  25 mg Oral TID PRN Jackelyn PolingJason A Berry, NP   25 mg at 11/26/15 2303  . loratadine (CLARITIN) tablet 10 mg  10 mg Oral Daily Craige CottaFernando A Cobos, MD   10 mg at 11/27/15 0753  . magnesium hydroxide (MILK OF MAGNESIA) suspension 30 mL  30 mL Oral Daily PRN Jackelyn PolingJason A Berry, NP      . sertraline (ZOLOFT) tablet 200 mg  200 mg Oral Daily Craige CottaFernando A Cobos, MD   200 mg at 11/27/15 0753  . traZODone (DESYREL) tablet 150 mg  150 mg Oral QHS Jackelyn PolingJason A Berry, NP   150 mg at 11/26/15 2304    PTA Medications: Prescriptions Prior to Admission  Medication Sig Dispense Refill Last Dose  . cholecalciferol (VITAMIN D) 1000 units tablet Take 1,000 Units by mouth daily.     . busPIRone (BUSPAR) 15 MG tablet Take 1 tablet (15 mg total) by mouth 3 (three) times daily. 90 tablet 0   . sertraline (ZOLOFT) 100 MG tablet Take 1 tablet (100 mg total) by mouth daily. 30 tablet 0   . traZODone (DESYREL) 150 MG tablet Take 1 tablet (150 mg total) by mouth at bedtime. 30 tablet 0     Treatment  Modalities: Medication Management, Group therapy, Case management,  1 to 1 session with clinician, Psychoeducation, Recreational therapy.  Patient Stressors:    Patient Strengths: Ability for insight Active sense of humor Average or above average intelligence Capable of independent living MetallurgistCommunication skills Financial means General fund of knowledge Motivation for treatment/growth Physical Health Supportive family/friends Work Firef[employer]  Physician Treatment Plan for Primary Diagnosis: MDD Long Term Goal(s): Improvement in symptoms so as ready for discharge  Short Term Goals: Ability to identify changes in lifestyle to reduce recurrence of condition will improve, Ability to verbalize feelings will improve, Ability to disclose and discuss suicidal ideas, Ability to demonstrate self-control will improve, Ability to identify and develop effective coping behaviors will improve, Ability to maintain clinical measurements within normal limits will improve, Compliance with prescribed medications will improve and Ability to identify triggers associated with substance abuse/mental health issues will improve  Medication Management: Evaluate patient's response, side effects, and tolerance of medication regimen.  Therapeutic Interventions: 1 to 1 sessions, Unit Group sessions and Medication administration.  Evaluation of Outcomes: Adequate for Discharge  Physician Treatment Plan for Secondary Diagnosis: Active Problems:   Suicidal ideation   Long Term Goal(s): Improvement in symptoms so as ready for discharge  Short Term Goals: Ability to identify changes in lifestyle to reduce recurrence of condition will improve, Ability to verbalize feelings will improve, Ability to disclose and discuss suicidal ideas, Ability to demonstrate self-control will improve, Ability to identify and develop effective coping behaviors will improve, Ability to maintain clinical measurements within normal limits will  improve, Compliance with prescribed medications will improve and Ability to identify triggers associated with substance abuse/mental health issues will improve  Medication Management: Evaluate patient's response, side effects, and tolerance of medication regimen.  Therapeutic Interventions: 1 to 1 sessions, Unit Group sessions and Medication administration.  Evaluation of Outcomes: Adequate for Discharge   RN Treatment Plan for Primary Diagnosis: MDD Long Term Goal(s): Knowledge of disease and therapeutic regimen to maintain health will improve  Short Term Goals: Ability to remain free from injury will improve, Ability to verbalize feelings will improve, Ability to disclose and discuss suicidal ideas and Ability to identify and develop effective coping behaviors will improve  Medication Management: RN will administer medications as ordered by provider, will assess and evaluate patient's response and provide education to patient for prescribed medication. RN will report any adverse and/or side effects to prescribing provider.  Therapeutic Interventions: 1 on 1 counseling sessions, Psychoeducation, Medication administration, Evaluate responses to treatment, Monitor vital signs and CBGs as ordered, Perform/monitor CIWA, COWS, AIMS and Fall Risk screenings as ordered, Perform wound care treatments as ordered.  Evaluation of Outcomes: Adequate for Discharge   LCSW Treatment Plan for Primary Diagnosis: MDD Long Term Goal(s): Safe transition to appropriate next level of care at discharge, Engage patient in therapeutic group addressing interpersonal concerns.  Short Term Goals: Engage patient in aftercare planning with referrals and resources, Increase emotional regulation, Facilitate acceptance of mental health diagnosis and concerns and Increase skills for wellness and recovery  Therapeutic Interventions: Assess for all discharge needs, 1 to 1 time with Social worker, Explore available resources  and support systems, Assess for adequacy in community support network, Educate family and significant other(s) on suicide prevention, Complete Psychosocial Assessment, Interpersonal group therapy.  Evaluation of Outcomes: Adequate for Discharge   Progress in Treatment: Attending groups: Yes Participating in groups: Yes  Taking medication as prescribed: Yes, MD continues to assess for medication changes as needed Toleration medication: Yes, no side effects reported at this time Family/Significant other contact made: No, 2 unsuccessful contact attempts with mother Patient understands diagnosis: Continuing to assess Discussing patient identified problems/goals with staff: Yes Medical problems stabilized or resolved: Yes Denies suicidal/homicidal ideation: Yes Issues/concerns per patient self-inventory: None Other: N/A  New problem(s) identified: None identified at this time.   New Short Term/Long Term Goal(s): None identified at this time.   Discharge Plan or Barriers: Pt will return home and follow-up with outpatient services.   Reason for Continuation of Hospitalization: Anxiety Depression Medication stabilization Suicidal ideation  Estimated Length of Stay: 0 days  Attendees: Patient: 11/27/2015  10:01 AM  Physician: Dr. Jama Flavors 11/27/2015  10:01 AM  Nursing: Romeo Rabon 11/27/2015  10:01 AM  RN Care Manager: Onnie Boer, RN 11/27/2015  10:01 AM  Social Worker: Chad Cordial, LCSW;  11/27/2015  10:01 AM  Recreational Therapist:  11/27/2015  10:01 AM  Other:Tanika Petra Kuba; NP 11/27/2015  10:01 AM  Other:  11/27/2015  10:01 AM  Other: 11/27/2015  10:01 AM    Scribe for Treatment Team: Elaina Hoops, LCSW 11/27/2015 10:01 AM
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
## 2015-11-27 NOTE — [facility] Suicide Risk Assessment (Signed)
[facility] INPATIENT:  Family/Significant Other Suicide Prevention Education  Suicide Prevention Education:  Contact Attempts: Garnette ScheuermannLouise Harned, Pt's mother [phone], has been identified by the patient as the family member/significant other with whom the patient will be residing, and identified as the person(s) who will aid the patient in the event of a mental health crisis.  With written consent from the patient, two attempts were made to provide suicide prevention education, prior to and/or following the patient's discharge.  We were unsuccessful in providing suicide prevention education.  A suicide education pamphlet was given to the patient to share with family/significant other.  Date and time of first attempt: 11/23/15 @ 2:55pm Date and time of second attempt: 11/27/15 @ 9:55am  Leotis ShamesLauren Lavonna RuaM Carter 11/27/2015, 9:55 AM

## 2015-11-27 NOTE — Progress Notes (Signed)
Discharge note: [facility] by Wynn BankerHeather R., RN. Pt received both written and verbal discharge instructions. Pt verbalized understanding of discharge instructions. Pt given prescriptions, SRA, AVS and transition record. Pt gathered belongings from room and locker. Pt safely discharged. Pt escorted to outpt by security at pt request.

## 2015-11-27 NOTE — Progress Notes (Signed)
D: Pt endorses moderate depression and anxiety; states, "I am always anxious and depressed but today I can concentrate a little better." Pt continued to be hyper-active and restless. Pt was o observed interacting with peers. Pt denies pain, SI, HI or AVH. Pt continues to be cooperative. A: Medications offered as prescribed.  Support, encouragement, and safe environment provided.  15-minute safety checks continue. R: Pt was med compliant.  Pt attended wrap-up group however. Safety checks continue.

## 2015-11-27 NOTE — Discharge Summary (Signed)
Physician Discharge Summary Note  Patient:  Sarah Good is an 25 y.o., female MRN:  [id number] DOB:  [birth year] Patient phone:  [phone] (home)  Patient address:   [address] [place] [address],  Total Time spent with patient: 45 minutes   Date of Admission:  11/19/2015 Date of Discharge: 11/27/2015  Reason for Admission:  Patientis an 25 y.o.single femalewho presents unaccompanied [facility] reporting symptoms of depression, including suicidal ideation. Patient  was inpatient at [facility] 11/09/15-11/16/15. She states since discharge she had felt increasingly depressed and scales her current depression as 9/10. Patient  reports symptoms including crying spells, social withdrawal, loss of interest in usual pleasures, fatigue, decreased concentration, decreased appetite and feelings of hopelessness. Patient  reports she has lost ten pounds in the past 4-6 weeks. She reports experiencing 3-4 panic attacks per day. She  Stated on admission, "I don't see a point in being alive." Patient continues to  report suicidal ideation . She does not have a specific plan because she is here. She  denies any history of suicide attempts. She reports a history of cutting and says she last cut in June 2017. She  denies homicidal ideation or history of violence. Patient  denies any history of psychotic symptoms and t denies alcohol or substance abuse.  Principal Problem: Major depressive disorder, recurrent severe without psychotic features [facility]) Discharge Diagnoses: Patient Active Problem List   Diagnosis Date Noted  . Suicidal ideation [R45.851] 11/19/2015    Priority: High  . Major depressive disorder, recurrent severe without psychotic features (HCC) [F33.2] 11/09/2015    Priority: High  . Pyelonephritis [N12] 09/20/2015  . Nausea [R11.0] 09/20/2015  . Leukocytosis [D72.829] 09/20/2015  . Hypokalemia [E87.6] 09/20/2015  . Anxiety [F41.9] 09/20/2015  . Nephrolithiasis [N20.0]      Past Psychiatric History: see HPI  Past Medical History:  Past Medical History:  Diagnosis Date  . Anxiety   . Migraine   . Mitral valve prolapse     Past Surgical History:  Procedure Laterality Date  . WISDOM TOOTH EXTRACTION     Family History:  Family History  Problem Relation Age of Onset  . Adopted: Yes   Family Psychiatric  History: see HPI Social History:  History  Alcohol Use  . Yes    Comment: socially     History  Drug Use  . Types: Marijuana    Comment: Second-hand    Social History   Social History  . Marital status: Single    Spouse name: N/A  . Number of children: N/A  . Years of education: N/A   Social History Main Topics  . Smoking status: Never Smoker  . Smokeless tobacco: Never Used  . Alcohol use Yes     Comment: socially  . Drug use:     Types: Marijuana     Comment: Second-hand  . Sexual activity: Yes    Birth control/ protection: IUD   Other Topics Concern  . None   Social History Narrative  . None    Hospital Course:    [name] was admitted for Major depressive disorder, recurrent severe without psychotic features (HCC), and crisis management.  Pt was treated discharged with the medications listed below under Medication List.  Medical problems were identified and treated as needed.  Home medications were restarted as appropriate.  Improvement was monitored by observation and [name] 's daily report of symptom reduction.  Emotional and mental status was monitored by  daily self-inventory reports completed by [name] and clinical staff.         Kateria Harned [name] was evaluated by the treatment team for stability and plans for continued recovery upon discharge. Ishita Harned [name] 's motivation was an integral factor for scheduling further treatment. Employment, transportation, bed availability, health status, family support, and any pending legal issues were also considered during hospital  stay. Pt was offered further treatment options upon discharge including but not limited to Residential, Intensive Outpatient, and Outpatient treatment.  Jamelyn Harned [name] will follow up with the services as listed below under Follow Up Information.      Upon completion of this admission the patient was both mentally and medically stable for discharge denying suicidal/homicidal ideation, auditory/visual/tactile hallucinations, delusional thoughts and paranoia.   Yeraldi Harned [name] responded well to treatment with Buspar, Gabapentin, Trazodone, and Vistaril without adverse effects. Pt demonstrated improvement without reported or observed adverse effects to the point of stability appropriate for outpatient management. Reviewed CBC, CMP, BAL, and UDS; all unremarkable aside from noted exceptions.    Physical Findings: AIMS: Facial and Oral Movements Muscles of Facial Expression: None, normal Lips and Perioral Area: None, normal Jaw: None, normal Tongue: None, normal,Extremity Movements Upper (arms, wrists, hands, fingers): None, normal Lower (legs, knees, ankles, toes): None, normal, Trunk Movements Neck, shoulders, hips: None, normal, Overall Severity Severity of abnormal movements (highest score from questions above): None, normal Incapacitation due to abnormal movements: None, normal Patient's awareness of abnormal movements (rate only patient's report): No Awareness, Dental Status Current problems with teeth and/or dentures?: No Does patient usually wear dentures?: No  CIWA:  CIWA-Ar Total: 1 COWS:  COWS Total Score: 2  Musculoskeletal: Strength & Muscle Tone: within normal limits Gait & Station: normal Patient leans: N/A  Psychiatric Specialty Exam: Physical Exam  Nursing note and vitals reviewed.   Review of Systems  Psychiatric/Behavioral: Positive for depression. Negative for hallucinations, substance abuse and suicidal ideas. The patient is nervous/anxious.   All other  systems reviewed and are negative.   Blood pressure 99/66, pulse 86, temperature 98.1 F (36.7 C), temperature source Oral, resp. rate 18, height 5\' 4"  (1.626 m), weight 76.7 kg (169 lb), SpO2 100 %.Body mass index is 29.01 kg/m.  SEE MD PSE WITHIN THE SRA  Have you used any form of tobacco in the last 30 days? (Cigarettes, Smokeless Tobacco, Cigars, and/or Pipes): No  Has this patient used any form of tobacco in the last 30 days? (Cigarettes, Smokeless Tobacco, Cigars, and/or Pipes) No  Blood Alcohol level:  No results found for: [facility]  Metabolic Disorder Labs:  No results found for: HGBA1C, MPG No results found for: PROLACTIN No results found for: CHOL, TRIG, HDL, CHOLHDL, VLDL, LDLCALC  See Psychiatric Specialty Exam and Suicide Risk Assessment completed by Attending Physician prior to discharge.  Discharge destination:  Home  Is patient on multiple antipsychotic therapies at discharge:  No   Has Patient had three or more failed trials of antipsychotic monotherapy by history:  No  Recommended Plan for Multiple Antipsychotic Therapies: NA     Medication List    STOP taking these medications   cholecalciferol 1000 units tablet Commonly known as:  VITAMIN D     TAKE these medications     Indication  busPIRone 15 MG tablet Commonly known as:  BUSPAR Take 1 tablet (15 mg total) by mouth 3 (three) times daily.  Indication:  Generalized Anxiety Disorder   gabapentin 300 MG capsule Commonly  known as:  NEURONTIN Take 1 capsule (300 mg total) by mouth 3 (three) times daily.  Indication:  mood stabilization   hydrOXYzine 25 MG tablet Commonly known as:  ATARAX/VISTARIL Take 1 tablet (25 mg total) by mouth 3 (three) times daily as needed for anxiety.  Indication:  Anxiety Neurosis   sertraline 100 MG tablet Commonly known as:  ZOLOFT Take 2 tablets (200 mg total) by mouth daily. Start taking on:  11/28/2015 What changed:  how much to take  Indication:  Major Depressive  Disorder   traZODone 150 MG tablet Commonly known as:  DESYREL Take 1 tablet (150 mg total) by mouth at bedtime.  Indication:  Trouble Sleeping      Follow-up Information    BEHAVIORAL HEALTH PARTIAL HOSPITALIZATION PROGRAM Follow up on 11/27/2015.   Specialty:  Behavioral Health Why:  at 1:30pm to begin the partial hospitalization program. Contact information: [address] [id number] mc [address] [place] [address] [phone]       Dennis Bast, MD.   Specialty:  Internal Medicine Contact information: [address] [place] [address] [phone]           Follow-up recommendations:  Activity:  as tol Diet:  as tol  Comments:  1.  Take all your medications as prescribed.   2.  Report any adverse side effects to outpatient provider. 3.  Patient instructed to not use alcohol or illegal drugs while on prescription medicines. 4.  In the event of worsening symptoms, instructed patient to call 911, the crisis hotline or go to nearest emergency room for evaluation of symptoms.  Signed: Beau Fanny, FNP [facility] 11/27/2015, 10:10 AM   Patient seen, Suicide Assessment Completed.  Disposition Plan Reviewed

## 2015-11-27 NOTE — Progress Notes (Signed)
  [facility] Discharge Plan :  Will you be returning to the same living situation after discharge:  Yes,  Pt returning home At discharge, do you have transportation home?: Yes,  Pt friend to pick up Do you have the ability to pay for your medications: Yes,  Pt provided prescriptions  Release of information consent forms completed and in the chart;  Patient's signature needed at discharge.  Patient to Follow up at: Follow-up Information    BEHAVIORAL HEALTH PARTIAL HOSPITALIZATION PROGRAM Follow up on 11/27/2015.   Specialty:  Behavioral Health Why:  at 1:30pm to begin the partial hospitalization program. Contact information: [address] [id number] [address]c [address] [place] [id number] [phone]       Dennis BastABEZA,YURI, MD.   Specialty:  Internal Medicine Contact information: [address] [place] [id number] [phone]           Next level of care provider has [facility] to [facility] Link:yes  Safety Planning and Suicide Prevention discussed: Yes,  with Pt; 2 unsuccessful attempts with mother  Have you used any form of tobacco in the last 30 days? (Cigarettes, Smokeless Tobacco, Cigars, and/or Pipes): No  Has patient been referred to the Quitline?: N/A patient is not a smoker  Patient has been referred for addiction treatment: Yes  Sarah Good 11/27/2015, 10:02 AM

## 2015-11-27 NOTE — BHH Suicide Risk Assessment (Signed)
[facility]   Principal Problem: Major depressive disorder, recurrent severe without psychotic features [facility](HCC) Discharge Diagnoses:  Patient Active Problem List   Diagnosis Date Noted  . Suicidal ideation [R45.851] 11/19/2015  . Major depressive disorder, recurrent severe without psychotic features (HCC) [F33.2] 11/09/2015  . Pyelonephritis [N12] 09/20/2015  . Nausea [R11.0] 09/20/2015  . Leukocytosis [D72.829] 09/20/2015  . Hypokalemia [E87.6] 09/20/2015  . Anxiety [F41.9] 09/20/2015  . Nephrolithiasis [N20.0]     Total Time spent with patient: 30 minutes  Musculoskeletal: Strength & Muscle Tone: within normal limits Gait & Station: normal Patient leans: N/A  Psychiatric Specialty Exam: ROS no headache , no chest pain, no shortness of breath, no nausea, no vomiting  Blood pressure 99/66, pulse 86, temperature 98.1 F (36.7 C), temperature source Oral, resp. rate 18, height 5\' 4"  (1.626 m), weight 169 lb (76.7 kg), SpO2 100 %.Body mass index is 29.01 kg/m.  General Appearance: Well Groomed  Eye Contact::  Good  Speech:  Normal Rate409  Volume:  Normal  Mood:  mood significantly improved compared to admission  Affect:  Appropriate and Full Range  Thought Process:  Linear  Orientation:  Full (Time, Place, and Person)  Thought Content:  denies hallucinations, no delusions, not internally preoccupied   Suicidal Thoughts:  No denies suicidal or self injurious ideations   Homicidal Thoughts:  No denies any violent or homicidal ideations   Memory:  recent and remote grossly intact   Judgement:  Other:  improved  Insight:  improved   Psychomotor Activity:  Normal  Concentration:  Good  Recall:  Good  Fund of Knowledge:Good  Language: Good  Akathisia:  Negative  Handed:  Right  AIMS (if indicated):     Assets:  Communication Skills Desire for Improvement Resilience  Sleep:  Number of Hours: 5.5  Cognition: WNL  ADL's:  Intact   Mental Status  Per Nursing Assessment::   On Admission:  Suicidal ideation indicated by patient  Demographic Factors:  [age] year old single female, lives with roommates, employed   Loss Factors: Strained family relationships  Historical Factors: History of depression, history of recent psychiatric admission   Risk Reduction Factors:   Sense of responsibility to family, Employed, Living with another person, especially a relative, Positive social support and Positive coping skills or problem solving skills  Continued Clinical Symptoms:  At this time patient is alert, attentive, well related, improved mood , states she feels much better than on admission, affect appropriate, reactive, no thought disorder, no suicidal ideations, no self injurious ideations, no hallucinations, no delusions, future oriented . Denies medication side effects.   Cognitive Features That Contribute To Risk:  No gross cognitive deficits noted upon discharge. Is alert , attentive, and oriented x 3   Suicide Risk:  Mild:  Suicidal ideation of limited frequency, intensity, duration, and specificity.  There are no identifiable plans, no associated intent, mild dysphoria and related symptoms, good self-control (both objective and subjective assessment), few other risk factors, and identifiable protective factors, including available and accessible social support.  Follow-up Information    BEHAVIORAL HEALTH PARTIAL HOSPITALIZATION PROGRAM Follow up on 11/27/2015.   Specialty:  Behavioral Health Why:  at 1:30pm to begin the partial hospitalization program. Contact information: [address] [id number] mc [address] [id number] [phone]       Dennis BastABEZA,YURI, MD.   Specialty:  Internal Medicine Contact information: [address] [place] [address] [phone]  Plan Of Care/Follow-up recommendations:  Activity:  as tolerated  Diet:  Regular  Tests:  NA Other:  See  below Patient is leaving unit in good spirits  Plans to return home Plans to follow up as above  Nehemiah Massed, MD 11/27/2015, 11:53 AM

## 2015-11-28 ENCOUNTER — Encounter (HOSPITAL_COMMUNITY): Payer: Self-pay

## 2015-11-28 ENCOUNTER — Other Ambulatory Visit (HOSPITAL_COMMUNITY): Payer: 59 | Admitting: Occupational Therapy

## 2015-11-28 ENCOUNTER — Other Ambulatory Visit (HOSPITAL_COMMUNITY): Payer: 59 | Admitting: Licensed Clinical Social Worker

## 2015-11-28 ENCOUNTER — Encounter (HOSPITAL_COMMUNITY): Payer: Self-pay | Admitting: Occupational Therapy

## 2015-11-28 VITALS — BP 126/72 | HR 84 | Ht 63.38 in | Wt 173.6 lb

## 2015-11-28 DIAGNOSIS — F411 Generalized anxiety disorder: Secondary | ICD-10-CM

## 2015-11-28 DIAGNOSIS — F332 Major depressive disorder, recurrent severe without psychotic features: Secondary | ICD-10-CM
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
## 2015-11-28 NOTE — Progress Notes (Signed)
Patient presents with appropriate affect, level mood and reports just getting out of inpatient services 11/27/15 after 2 recent visits for suicidal ideations with a plan to harm self.  Patient denies any current suicidal thoughts, no plan or intent and denies any homicidal ideations.   Patient reports she lives [place] roommate in [place] and is currently taking a leave of absence from work to attend the Partial Hospitalization Program.  Patient reported liking her first day in the program and rated her current level of depression a 5, anxiety a 6 and hopelessness a 0 on a scale of 0-10 with 0 being none and 10 being the highest she could experience.  Patient reported Dr. Jama Flavorsobos had "tweaked" her medications and reported she was doing better now and thought the changes had been effective.  Patient reported sleeping 7-8 hours a night now with sound sleep taking Trazodone.  Reports no problems with current medication regimen and denies any side effects or problems taking medications daily independently.  Patient reported she has dull back pain at times but denies any pain currently or an ongoing problem.  Patient agreed to contact this nurse or member of PHP if any problems with medications, symptoms or side effects to medications arose.  Patient denies any health issues at this time and will continue with PHP daily during the week.

## 2015-11-28 NOTE — Therapy (Signed)
[facility] PART[facility] HOSPITAL[facility]ATION PROGRAM [address] [place], [place], [address] Phone: [phone]   Fax:  [phone]  Occupational Therapy Evaluation  Patient Details  Name: Sarah ShropshireRandall Harned [name] MRN: [id number] Date of Birth: Jun 26, 1990 Referring Provider: Dr. Nehemiah MassedFernando Cobos  Encounter Date: 11/28/2015      OT End of Session - 11/28/15 1641    Visit Number 1   Number of Visits 6   Date for OT Re-Evaluation 12/19/15   OT Start Time 1030   OT Stop Time 1129   OT Time Calculation (min) 59 min   Activity Tolerance Patient tolerated treatment well   Behavior During Therapy [facility] for tasks assessed/performed      Past Medical History:  Diagnosis Date  . Anxiety   . Migraine   . Mitral valve prolapse     Past Surgical History:  Procedure Laterality Date  . WISDOM TOOTH EXTRACTION      There were no vitals filed for this visit.      Subjective Assessment - 11/28/15 1640    Currently in Pain? No/denies           [facility] - 11/28/15 1640      Assessment   Diagnosis Major Depressive Disorder   Referring Provider Dr. Madaline GuthrieFernando Cobos   Onset Date --  chronic     Precautions   Precautions None     Balance Screen   Has the patient fallen in the past 6 months No   Has the patient had a decrease in activity level because of a fear of falling?  No   Is the patient reluctant to leave their home because of a fear of falling?  No        OT assessment Past medical history:  Major depressive disorder, borderline personality, anxiety/depression  Living situation:  Lives with a roommate. Mom lives in the area, Dad in [place]. Adopted.    ADLs: completes all ADL tasks for herself independently  Social support: Dad is supporting financially during PHP area, Dad and pt have "solid" relationship. Relationship with Mom is strained.   Struggles:  Stress management, communication skills   OT goal:  To improve her daily coping  skills   General Causality Orientation Scale    Subscore Percentile Score  Autonomy 42 21.10  Control 36 9.77  Impersonal 47 49.15    Motivation Type  Motivation type Explanation    Impersonal/amotivational  There is a lack of connection between any of the individual's behavior and his or her personal goals. The individual is likely in a passivity state or manifests non-goal-directed behavior. This is considered a type of motivational deficit and warrants motivational interventions.        Assessment:  Patient demonstrates impersonal/amotivational behaviors. This is considered a type of motivational deficit and warrants motivational interventions. Patient will benefit from occupational therapy intervention in order to improve time management, financial management, stress management, job readiness skills, social skills, sleep hygiene, exercise and healthy eating habits, and health management skills and other psychosocial skills needed for preparation to return to full time community living and to be a productive community member.     Plan:  Patient will participate in skilled occupational therapy sessions individually or in a group setting to improve coping skills, psychosocial skills, and emotional skills required to return to prior level of function as a productive community member. Treatment will be 1-2 times per week for 2-6 weeks.      OT Treatment Group:  S:  "I used to do yoga but can't now because I can't do the inversions with my heart palpitations." O:  Patient actively participated in the following skilled occupational therapy treatment session this date:             Stress management-discussed what stress is and how it affects one's life. [name]              completed the How Stressed am I? worksheet with a high stress level score of 11.              Discussed her personal stress symptoms and her current coping mechanisms or ways of dealing with stress.  [name]  engaged in  identifying causes of stress and identifying stress signals, which [name] identified for herself as fatigue,  stomach distress, anxiety, self-criticism,  decreased appetite, sleep disturbances, pounding heart, and tension. [name] c completed and discussed the Stress and Your Personality Type worksheet, identifying as type B personality. [name]  engaged in discussion of the effects of stress on health, both physical and mental health, including acute and chronic  stress. OT and [name] brainstormed stress management techniques to employ including relaxation techniques,  emotional regulation, exercise, music, and nutrition.  A:  Patient participated in skilled occupational therapy group for stress management skills this date.  Patient was engaged and hesitantly open to strategies introduced.  Patient is currently using meditation and walks as stress management. Discussed strategies for increasing exercise within restrictions from MD and continuing to incorporate music and relaxation strategies during stressful periods.  P:  Continue participation in skilled occupational therapy groups  1-2 times per week for 2 weeks in order to gain the necessary skills needed to return to full time community living and learn effective coping strategies to be a productive community resident. Follow up on HEP for finding an appropriate exercise plan such as pilates or walking her dogs.           OT Short Term Goals - 11/28/15 1643      OT SHORT TERM GOAL #1   Title Patient will be educated on strategies to improve psychosocial skills needed to participate fully in all daily, work, and leisure activities.   Time 3   Period Weeks   Status New     OT SHORT TERM GOAL #2   Title Patient will be educated on a HEP and independent with implementation of HEP.   Time 3   Period Weeks   Status New     OT SHORT TERM GOAL #3   Title Patient will independently apply psychosocial skills and coping mechanisms to her daily  actiivties in order to function independently.   Time 3   Period Weeks   Status New                  Plan - 11/28/15 1642    Rehab Potential Good   OT Frequency --  1-2x/week   OT Duration --  3 weeks   OT Treatment/Interventions Self-care/ADL training  coping skills mechanism training, psychosocial skills development, community reintegration   Consulted and Agree with Plan of Care Patient      Patient will benefit from skilled therapeutic intervention in order to improve the following deficits and impairments:   (coping skills training, psychosocial development)  Visit Diagnosis: Severe episode of recurrent major depressive disorder, without psychotic features (HCC)  GAD (generalized anxiety disorder)    Problem List Patient Active Problem List   Diagnosis Date Noted  .  Suicidal ideation 11/19/2015  . Major depressive disorder, recurrent severe without psychotic features (HCC) 11/09/2015  . Pyelonephritis 09/20/2015  . Nausea 09/20/2015  . Leukocytosis 09/20/2015  . Hypokalemia 09/20/2015  . Anxiety 09/20/2015  . Nephrolithiasis    Ezra Sites, OTR/L  [id number] 11/28/2015, 4:44 PM  [facility] PARTIAL HOSPITALIZATION PROGRAM [address] [place], [place], [address] Phone: [phone]   Fax:  [phone]  Name: Durene Dodge MRN: [id number] Date of Birth: Jun 02, 1990

## 2015-11-29 ENCOUNTER — Other Ambulatory Visit (HOSPITAL_COMMUNITY): Payer: 59 | Attending: Psychiatry | Admitting: Licensed Clinical Social Worker

## 2015-11-29 DIAGNOSIS — Z79899 Other long term (current) drug therapy: Secondary | ICD-10-CM | POA: Insufficient documentation

## 2015-11-29 DIAGNOSIS — F332 Major depressive disorder, recurrent severe without psychotic features: Secondary | ICD-10-CM | POA: Insufficient documentation

## 2015-11-29 DIAGNOSIS — F418 Other specified anxiety disorders: Secondary | ICD-10-CM | POA: Diagnosis not present

## 2015-11-29 DIAGNOSIS — F331 Major depressive disorder, recurrent, moderate: Secondary | ICD-10-CM | POA: Diagnosis present

## 2015-11-29 DIAGNOSIS — F411 Generalized anxiety disorder: Secondary | ICD-10-CM

## 2015-11-29 DIAGNOSIS — Z888 Allergy status to other drugs, medicaments and biological substances status: Secondary | ICD-10-CM | POA: Insufficient documentation

## 2015-11-29 NOTE — Psych (Signed)
   [facility] THERAPIST [facility]GRESS NOTE  [name] [id number]  Session Time: 9 AM - 2 PM  Participation Level: Active  Behavioral Response: CasualAlertEuthymic  Type of Therapy: Group Therapy  Treatment Goals addressed: Coping  Interventions: CBT, Motivational Interviewing, Supportive and Reframing  Summary: Clinician facilitated check-in regarding current stressors and situation, and review of patient completed diary card. Clinician utilized active listening and empathetic response and validated patient emotions. Clinician led discussion on unhealthy thought patterns. Clinician introduced topic of CBT and how thought patterns can shape our perception of reality. Clinician utilized handout "Eleven beliefs that make life better" to facilitate discussion.  Clinician assessed for immediate needs, medication compliance and efficacy, and safety concerns.    Suicidal/Homicidal: Nowithout intent/plan  Therapist Response: [name] is a 25 y.o. female who presents with depression and anxiety symptoms. Patient was within time allowed and reports she is feeling "pretty good." Patient rates her mood at a 5 on a 1-10 scale with 10 being very good. Patient participated in discussion and exhibited awareness into her thought processes. Patient demonstrates progress as evidenced by attendance and active participation, as this is her first session of group.Patient denies SI/HI/self-harm thoughts.     Plan: Patient will continue in PHP and medication management. Work towards decreasing depression symptoms and increase emotional regulation and positive coping skills.    Diagnosis: Severe episode of recurrent major depressive disorder, without psychotic features (HCC) [F33.2]    1. Severe episode of recurrent major depressive disorder, without psychotic features (HCC)   2. GAD (generalized anxiety disorder)       Donia GuilesJenny Mariama Saintvil, LCSW 11/29/2015

## 2015-11-30 ENCOUNTER — Other Ambulatory Visit (HOSPITAL_COMMUNITY): Payer: 59 | Admitting: Licensed Clinical Social Worker

## 2015-11-30 ENCOUNTER — Other Ambulatory Visit (HOSPITAL_COMMUNITY): Payer: 59 | Admitting: Specialist

## 2015-11-30 DIAGNOSIS — F332 Major depressive disorder, recurrent severe without psychotic features: Secondary | ICD-10-CM

## 2015-11-30 DIAGNOSIS — F411 Generalized anxiety disorder: Secondary | ICD-10-CM

## 2015-11-30 NOTE — Psych (Signed)
   [facility] THERAP[facility]  [name] [name] [id number]  Session Time: 9 AM - 2 PM  Participation Level: Active  Behavioral Response: CasualAlertEuthymic  Type of Therapy: Group Therapy  Treatment Goals addressed: Coping  Interventions: CBT, Motivational Interviewing, Supportive and Reframing  Summary: Clinician facilitated check-in regarding current stressors and situation, and review of patient completed diary card. Clinician utilized active listening and empathetic response and validated patient emotions. Clinician introduced topic of anger and how it effects your life. Clinician led art therapy activity and utilized handout "10 tips to tame your anger." Clinician assessed for immediate needs, medication compliance and efficacy, and safety concerns.    Suicidal/Homicidal: Nowithout intent/plan  Therapist Response: Kellie ShropshireRandall Harned [name] is a 25 y.o. female who presents with depression and anxiety symptoms. Patient was within time allowed and reports she is feeling "okay." Patient rates her mood at a 4 on a 1-10 scale with 10 being very good. Patient participated in discussion and activities, identifying the ways in which her anger manifests and is effecting her life. Patient demonstrates progress as evidenced by increased openness and willingness to try new skills. Patient denies SI/HI/self-harm thoughts.     Plan: Patient will continue in PHP and medication management. Work towards decreasing depression symptoms and increase emotional regulation and positive coping skills.    Diagnosis: GAD (generalized anxiety disorder) [F41.1]    1. GAD (generalized anxiety disorder)   2. Severe episode of recurrent major depressive disorder, without psychotic features (HCC)       Donia GuilesJenny Adrien Dietzman, LCSW 11/30/2015

## 2015-11-30 NOTE — Therapy (Signed)
[facility] [address] [place] , [place], [address] Phone: [phone]   Fax:  [phone]  Occupational Therapy Treatment  Patient Details  Name: Sarah Good MRN: [id number] Date of Birth: [birth year] Referring Provider: Dr. Nehemiah MassedFernando Cobos  Encounter Date: 11/30/2015      OT End of Session - 11/30/15 2113    Visit Number 2   Number of Visits 6   Date for OT Re-Evaluation 12/19/15      Past Medical History:  Diagnosis Date  . Anxiety   . Migraine   . Mitral valve prolapse     Past Surgical History:  Procedure Laterality Date  . WISDOM TOOTH EXTRACTION      There were no vitals filed for this visit.      Subjective Assessment - 11/30/15 2112    Currently in Pain? No/denies      S:  I definitely need to budget better. O:  Patient actively participated in the following skilled occupational therapy group this date: o Landscape architectinancial management - why budgeting is important, how to budget, spending pitfalls, strategies to begin saving Patient focused and engaged in group. A:  Patient participated in skilled occupational therapy group for financial management skills this date.  Patient was engaged and fully interactive with group and leader.. P:  Continue participation in skilled occupational therapy groups  1-2 times per week for 3 weeks in order to gain the necessary skills needed to return to full time community living and learn effective coping strategies to be a productive community resident. Next group focus will be health management.                          OT Education - 11/30/15 2113    Education provided Yes   Education Details savings strategies for 20 somethings, spending journal, budgeting work Education officer, environmentalsheet   Person(s) Educated Patient   Methods Explanation;Demonstration;Handout   Comprehension Verbalized understanding          OT Short Term Goals - 11/30/15 2114      OT  SHORT TERM GOAL #1   Title Patient will be educated on strategies to improve psychosocial skills needed to participate fully in all daily, work, and leisure activities.   Time 3   Period Weeks   Status On-going     OT SHORT TERM GOAL #2   Title Patient will be educated on a HEP and independent with implementation of HEP.   Time 3   Period Weeks   Status On-going     OT SHORT TERM GOAL #3   Title Patient will independently apply psychosocial skills and coping mechanisms to her daily actiivties in order to function independently.   Time 3   Period Weeks   Status On-going                Patient will benefit from skilled therapeutic intervention in order to improve the following deficits and impairments:     Visit Diagnosis: GAD (generalized anxiety disorder)  Severe episode of recurrent major depressive disorder, without psychotic features (HCC)    Problem List Patient Active Problem List   Diagnosis Date Noted  . Suicidal ideation 11/19/2015  . Major depressive disorder, recurrent severe without psychotic features (HCC) 11/09/2015  . Pyelonephritis 09/20/2015  . Nausea 09/20/2015  . Leukocytosis 09/20/2015  . Hypokalemia 09/20/2015  . Anxiety 09/20/2015  . Nephrolithiasis     Shirlean MylarBethany H. Kambria Grima, MHA, OTR/L [phone]  11/30/2015,  9:15 PM  [facility] PARTIAL HOSPITALIZATION PROGRAM [address] [place], [place], [address] Phone: [phone]   Fax:  [phone]  Name: Sarah Good MRN: [id number] Date of Birth: 1990-03-17

## 2015-12-01 ENCOUNTER — Other Ambulatory Visit (HOSPITAL_COMMUNITY): Payer: 59 | Admitting: Licensed Clinical Social Worker

## 2015-12-01 DIAGNOSIS — F411 Generalized anxiety disorder: Secondary | ICD-10-CM

## 2015-12-01 DIAGNOSIS — F332 Major depressive disorder, recurrent severe without psychotic features: Secondary | ICD-10-CM

## 2015-12-01 NOTE — Psych (Signed)
   [facility]  [name] [id number]  Session Time: 9 AM - 1 PM  Participation Level: Active  Behavioral Response: CasualAlertEuthymic  Type of Therapy: Group Therapy  Treatment Goals addressed: Coping  Interventions: CBT, Motivational Interviewing, Supportive and Reframing  Summary: Clinician facilitated check-in regarding current stressors and situation, and review of patient completed diary card. Clinician utilized active listening and empathetic response and validated patient emotions. Clinician led discussion on assertiveness, guilt, value systems, and self-image. Clinician led mindfulness exercise and art therapy activity. Clinician assessed for immediate needs, medication compliance and efficacy, and safety concerns.    Suicidal/Homicidal: Nowithout intent/plan  Therapist Response: [name] is a 25 y.o. female who presents with depression and anxiety symptoms. Patient was within time allowed and reports she is feeling "pretty anxious." Patient rates her mood at a 4 on a 1-10 scale with 10 being very good. Patient participated in discussion and activity. Patient demonstrates self awareness and insight during discussion and identified ways in which she can increase assertiveness. Patient denies SI/HI/self-harm thoughts.     Plan: Patient will continue in PHP and medication management. Work towards decreasing depression symptoms and increase emotional regulation and positive coping skills.    Diagnosis: GAD (generalized anxiety disorder) [F41.1]    1. GAD (generalized anxiety disorder)   2. Severe episode of recurrent major depressive disorder, without psychotic features (HCC)       Donia GuilesJenny Ceferino Lang, LCSW 12/01/2015

## 2015-12-01 NOTE — Psych (Signed)
   [facility]  Sarah Good [id number]  Session Time: 9 AM - 2 PM  Participation Level: Active  Behavioral Response: CasualAlertEuthymic  Type of Therapy: Group Therapy  Treatment Goals addressed: Coping  Interventions: CBT, Motivational Interviewing, Supportive and Reframing  Summary: Clinician facilitated check-in regarding current stressors and situation, and review of patient completed diary card. Clinician utilized active listening and empathetic response and validated patient emotions. Clinician led discussion on insecurity and self-doubt and how it effects your life. Clinician introduced grounding skills and brainstormed with pt distraction coping skills. Clinician assessed for immediate needs, medication compliance and efficacy, and safety concerns.    Suicidal/Homicidal: Nowithout intent/plan  Therapist Response: [name] is a 25 y.o. female who presents with depression and anxiety symptoms. Patient was within time allowed and reports she is feeling "pretty anxious." Patient rates her mood at a 4 on a 1-10 scale with 10 being very good. Patient participated in discussion and activity. Patient was able to recognize ways to reframe some insecurities. Patient identified which grounding skills she could utilize.  Patient demonstrates progress as evidenced by practicing skills in situ with group and increased openness. Patient denies SI/HI/self-harm thoughts.     Plan: Patient will continue in PHP and medication management. Work towards decreasing depression symptoms and increase emotional regulation and positive coping skills.    Diagnosis: GAD (generalized anxiety disorder) [F41.1]    1. GAD (generalized anxiety disorder)   2. Severe episode of recurrent major depressive disorder, without psychotic features (HCC)       Sarah GuilesJenny Siyon Linck, LCSW 12/01/2015

## 2015-12-04 ENCOUNTER — Other Ambulatory Visit (HOSPITAL_COMMUNITY): Payer: 59 | Admitting: Licensed Clinical Social Worker

## 2015-12-04 DIAGNOSIS — F332 Major depressive disorder, recurrent severe without psychotic features: Secondary | ICD-10-CM | POA: Diagnosis not present

## 2015-12-04 DIAGNOSIS — F411 Generalized anxiety disorder: Secondary | ICD-10-CM

## 2015-12-05 ENCOUNTER — Other Ambulatory Visit (HOSPITAL_COMMUNITY): Payer: 59 | Admitting: Occupational Therapy

## 2015-12-05 ENCOUNTER — Other Ambulatory Visit (HOSPITAL_COMMUNITY): Payer: 59 | Admitting: Licensed Clinical Social Worker

## 2015-12-05 ENCOUNTER — Encounter (HOSPITAL_COMMUNITY): Payer: Self-pay | Admitting: Occupational Therapy

## 2015-12-05 DIAGNOSIS — F332 Major depressive disorder, recurrent severe without psychotic features: Secondary | ICD-10-CM | POA: Diagnosis not present

## 2015-12-05 DIAGNOSIS — F411 Generalized anxiety disorder: Secondary | ICD-10-CM

## 2015-12-05 NOTE — Therapy (Signed)
[facility] [address] [place], [place], [address] Phone: [phone]   Fax:  [phone]  Occupational Therapy Treatment  Patient Details  Name: Sarah ShropshireRandall Harned [name] MRN: [id number] Date of Birth: Nov 06, 1990 Referring Provider: Dr. Nehemiah MassedFernando Cobos  Encounter Date: 12/05/2015      OT End of Session - 12/05/15 1650    Visit Number 3   Number of Visits 6   Date for OT Re-Evaluation 12/19/15   OT Start Time 1030   OT Stop Time 1130   OT Time Calculation (min) 60 min   Activity Tolerance Patient tolerated treatment well   Behavior [facility] Therapy [facility] for tasks assessed/performed      Past Medical History:  Diagnosis Date  . Anxiety   . Migraine   . Mitral valve prolapse     Past Surgical History:  Procedure Laterality Date  . WISDOM TOOTH EXTRACTION      There were no vitals filed for this visit.      Subjective Assessment - 12/05/15 1650    Currently in Pain? No/denies            [facility] - 12/05/15 1649      Assessment   Diagnosis Major Depressive Disorder     Precautions   Precautions None      OT Group: Time Management  S:  "I need a very structured schedule to keep my time management efficient." O:  Patient actively participated in the following skilled occupational therapy treatment session this date:             Time Management: [name] participated in discussion of time management skills   including organization and the task required. Discussed the value of time management,   effective versus ineffective time management, and the importance of structure in a daily   schedule. Discussed the five levels of structure for the college student, Donnal DebarRandi   identifying as level 2-minimal structure plan. [name] completed the activity wheel for her  typical weekday, noting that she would like to have more time for sleep. [name] engaged in group discussion on current time management struggles  and what she could alter in her daily lifestyle or schedule to be more productive.  A:  Patient participated in skilled occupational therapy group for time management skills this date.  Patient was engaged and open to strategies introduced.  Patient is currently using a daily planner to assist with time management. Discussed strategies for utilizing time management strategies to gain greater control and calm during daily tasks.   P:  Continue participation in skilled occupational therapy groups  1-2 times per week for 2 weeks in order to gain the necessary skills needed to return to full time community living and learn effective coping strategies to be a productive community resident. Follow up on HEP for time management.          OT Short Term Goals - 11/30/15 2114      OT SHORT TERM GOAL #1   Title Patient will be educated on strategies to improve psychosocial skills needed to participate fully in all daily, work, and leisure activities.   Time 3   Period Weeks   Status On-going     OT SHORT TERM GOAL #2   Title Patient will be educated on a HEP and independent with implementation of HEP.   Time 3   Period Weeks   Status On-going     OT SHORT TERM GOAL #3   Title  Patient will independently apply psychosocial skills and coping mechanisms to her daily actiivties in order to function independently.   Time 3   Period Weeks   Status On-going                Patient will benefit from skilled therapeutic intervention in order to improve the following deficits and impairments:   coping skills mechanism training, psychosocial development, community reintegration.   Visit Diagnosis: GAD (generalized anxiety disorder)  Severe episode of recurrent major depressive disorder, without psychotic features [facility])    Problem List Patient Active Problem List   Diagnosis Date Noted  . Suicidal ideation 11/19/2015  . Major depressive disorder, recurrent severe without psychotic  features (HCC) 11/09/2015  . Pyelonephritis 09/20/2015  . Nausea 09/20/2015  . Leukocytosis 09/20/2015  . Hypokalemia 09/20/2015  . Anxiety 09/20/2015  . Nephrolithiasis    Ezra Sites, OTR/L  [phone] 12/05/2015, 4:50 PM  [facility] HOSPITALIZATION PROGRAM [address] [place], [place], [address] Phone: [phone]   Fax:  [phone]  Name: Sarah Good MRN: [id number] Date of Birth: 1990-03-21

## 2015-12-05 NOTE — Psych (Signed)
   [facility]  [name] [name] [id number]  Session Time: 9 AM - 2 PM  Participation Level: Active  Behavioral Response: CasualAlertEuthymic  Type of Therapy: Group Therapy  Treatment Goals addressed: Coping  Interventions: CBT, DBT, Supportive and Reframing  Summary: Clinician facilitated check-in regarding current stressors and situation, and review of patient completed diary card. Clinician utilized active listening and empathetic response and validated patient emotions. Clinician led discussion on topic of insecurities, being stuck in your thoughts, and skillfulness. Clinician introduced topic of needs and utilized handout "needs assessment."  Clinician assessed for immediate needs, medication compliance and efficacy, and safety concerns.    Suicidal/Homicidal: Nowithout intent/plan  Therapist Response: Kellie ShropshireRandall Harned [name] is a 25 y.o. female who presents with depression and anxiety symptoms. Patient was within time allowed and reports she is feeling "not good." Patient rates her mood at a 3 on a 1-10 scale with 10 being very good. Patient reports her partner broke up with her this weekend and that has effected her mood. Patient participated in discussion and activity. Patient demonstrates continued progress as evidenced by reporting skillfulness during escalated weekend. Patient denies SI/HI/self-harm thoughts.     Plan: Patient will continue in PHP and medication management. Work towards decreasing depression symptoms and increase emotional regulation and positive coping skills.    Diagnosis: GAD (generalized anxiety disorder) [F41.1]    1. GAD (generalized anxiety disorder)   2. Severe episode of recurrent major depressive disorder, without psychotic features (HCC)       Donia GuilesJenny Hyla Coard, LCSW 12/05/2015

## 2015-12-06 ENCOUNTER — Other Ambulatory Visit (HOSPITAL_COMMUNITY): Payer: 59 | Admitting: Licensed Clinical Social Worker

## 2015-12-06 DIAGNOSIS — F411 Generalized anxiety disorder: Secondary | ICD-10-CM

## 2015-12-06 DIAGNOSIS — F332 Major depressive disorder, recurrent severe without psychotic features: Secondary | ICD-10-CM

## 2015-12-06 NOTE — Psych (Signed)
   [facility]  [name] [id number]  Session Time: 9 AM - 2 PM  Participation Level: Active  Behavioral Response: CasualAlertEuthymic  Type of Therapy: Group Therapy  Treatment Goals addressed: Coping  Interventions: CBT, DBT, Supportive and Reframing  Summary: Clinician facilitated check-in regarding current stressors and situation, and review of patient completed diary card. Clinician utilized active listening and empathetic response and validated patient emotions. Clinician led discussion on topic of anxious feelings and how to manage them and moving on after broken relationships. Clinician introduced topic of stress management and stress reduction techniques. Clinician assessed for immediate needs, medication compliance and efficacy, and safety concerns.    Suicidal/Homicidal: Nowithout intent/plan  Therapist Response: [name] is a 25 y.o. female who presents with depression and anxiety symptoms. Patient was within time allowed and reports she is feeling "okay." Patient rates her mood at a 6 on a 1-10 scale with 10 being very good. Patient participated in discussion and activity. Patient demonstrates continued progress as evidenced by recognizing skills she can use to address her symptoms. Patient denies SI/HI/self-harm thoughts.     Plan: Patient will continue in PHP and medication management. Work towards decreasing depression symptoms and increase emotional regulation and positive coping skills.    Diagnosis: GAD (generalized anxiety disorder) [F41.1]    1. GAD (generalized anxiety disorder)   2. Severe episode of recurrent major depressive disorder, without psychotic features (HCC)       Donia GuilesJenny Hendy Brindle, LCSW 12/06/2015

## 2015-12-07 ENCOUNTER — Other Ambulatory Visit (HOSPITAL_COMMUNITY): Payer: Self-pay

## 2015-12-07 ENCOUNTER — Ambulatory Visit (HOSPITAL_COMMUNITY): Payer: Self-pay

## 2015-12-07 NOTE — Psych (Signed)
   [facility]  [name] [name] [id number]  Session Time: 9 AM - 2 PM  Participation Level: Active  Behavioral Response: CasualAlertAnxious  Type of Therapy: Group Therapy  Treatment Goals addressed: Coping  Interventions: CBT, DBT, Supportive and Reframing  Summary: Clinician facilitated check-in regarding current stressors and situation, and review of patient completed diary card. Clinician utilized active listening and empathetic response and validated patient emotions. Clinician facilitated patients discussing pertinent topics during a process session. Patient discussed anxiety around feeling her sleep medication may need to be increased. Patient worries about what her mother will think if she increases medication, and potential harm to organs if she continues to take the medication. Clinician encouraged the patient to discuss these concerns with her doctor and to not make any changes before speaking with the doctor. Clinician introduced topic of cognitive distortions. Patient provided examples of common cognitive distortions she experiences. Clinician and client discussed methods of combating cognitive distortions including "check the facts" and using the "best friend test." Clinician introduced Emotional/Rational/Wise mind. Client identified different scenarios that involve each part of the mind.  Clinician assessed for immediate needs, medication compliance and efficacy, and safety concerns.    Suicidal/Homicidal: Nowithout intent/plan  Therapist Response: Kellie ShropshireRandall Harned [name] is a 25 y.o. female who presents with depression and anxiety symptoms. Patient was within time allowed and reports she is feeling "okay." Patient rates her mood at a 5 on a 1-10 scale with 10 being very good. Patient engaged in group activity and discussion. Patient provided specific examples of ways to combat common cognitive distortions she has. Patient demonstrates continued progress  as evidenced by continued participation in group and self-report of increased practice of skills learned within group. Patient denies any immediate needs. Patient denies SI/HI/self-harm thoughts.     Plan: Patient will continue in PHP and medication management. Work towards decreasing depression symptoms and increase emotional regulation and positive coping skills.    Diagnosis: GAD (generalized anxiety disorder) [F41.1]    1. GAD (generalized anxiety disorder)   2. Severe episode of recurrent major depressive disorder, without psychotic features (HCC)       Donia GuilesJenny Hanako Tipping, LCSW 12/07/2015

## 2015-12-08 ENCOUNTER — Other Ambulatory Visit (HOSPITAL_COMMUNITY): Payer: 59 | Admitting: Licensed Clinical Social Worker

## 2015-12-08 VITALS — BP 102/68 | HR 86 | Ht 64.0 in | Wt 173.0 lb

## 2015-12-08 DIAGNOSIS — F332 Major depressive disorder, recurrent severe without psychotic features: Secondary | ICD-10-CM

## 2015-12-08 DIAGNOSIS — F411 Generalized anxiety disorder: Secondary | ICD-10-CM

## 2015-12-08 MED ORDER — HYDROXYZINE HCL 50 MG PO TABS: 50.0000 mg | ORAL_TABLET | Freq: Three times a day (TID) | ORAL | 0 refills | Status: DC

## 2015-12-08 MED ORDER — TRAZODONE HCL 100 MG PO TABS: 200.0000 mg | ORAL_TABLET | Freq: Every day | ORAL | 0 refills | Status: DC

## 2015-12-08 NOTE — Progress Notes (Signed)
Met with patient to assess her status with reported increased anxiety this date and over the past few days.  Patient presented with appropriate affect, level but anxious mood and stated she was also having increased problems with sleep.  Reviewed patient's current medications as she stated her anxiety has increased to a 9 on a scale of 1 to 10 with 1 being none and 10 being the worst she could experience.  Patient reported only sleeping 5-6 hours with increased problems going to sleep and waking up during the night.  Patient rated her depression a 5 and hopelessness a 6 on the same 1-10 scale.  Patient stated sleep medication and anxiety medication were working better when first started and when she was in the hospital but gradually not being as effective at this time.  States she is taking Trazodone 150 mg every night at bedtime and Vistaril 25 mg three times a day daily.  Met with Dr. Dwyane Dee, helping to cover while Dr. Parke Poisson is off this week who reviewed patient's current medications.  Patient to increase Vistaril to 50 mg three times a day and increase Trazodone to 200 mg at bedtime. Reviewed this plan with patient and agreed to [facility] in new orders [facility] [place].  Patient stated plan to try changes over the coming weekend and to inform how working when returns for [facility] on 12/11/15.  Patient also agreed to continue to learned coping skills and breathing techniques learned in PHP. New orders [facility]-scribed [facility] as ordered by Dr. Dwyane Dee and patient stated understanding how to take with changes.  Patient to inform if makes drowsy during the day with increase in Vistaril or any other adverse side effects with changes.  Patient stable at this time and denies any SI/HI.  Patient to contact this nurse or PHP staff if any problems or concerns.

## 2015-12-08 NOTE — Psych (Signed)
   [facility]  [name] [id number]  Session Time: 9 AM - 1 PM  Participation Level: Active  Behavioral Response: CasualAlertAnxious  Type of Therapy: Group Therapy  Treatment Goals addressed: Coping  Interventions: CBT, DBT, Supportive and Reframing  Summary: Clinician facilitated check-in regarding current stressors and situation, and review of patient completed diary card. Clinician utilized active listening and empathetic response and validated patient emotions. Clinician led discussion on radical acceptance and control. Clinician led art therapy activity about self soothing. Clinician reviewed topic of distress tolerance skills and introduced ACCEPTS.  Clinician assessed for immediate needs, medication compliance and efficacy, and safety concerns.    Suicidal/Homicidal: Nowithout intent/plan  Therapist Response: [name] is a 25 y.o. female who presents with depression and anxiety symptoms. Patient was within time allowed and reports she is feeling "better." Patient rates her mood at a 4 on a 1-10 scale with 10 being very good. Patient shares that she thinks she has been "dissociating" and that she has shared skills with her roommates to help during these times. Patient engaged in group activity and discussion.Patient identified ways in which she could utilize skills discussed. Patient demonstrates continued progress as evidenced by reporting use of skills outside of group however shows some resistance to utilizing skills when she wants it to be easier.  Patient denies SI/HI/self-harm thoughts.     Plan: Patient will continue in PHP and medication management. Work towards decreasing depression symptoms and increase emotional regulation and positive coping skills.    Diagnosis: GAD (generalized anxiety disorder) [F41.1]    1. GAD (generalized anxiety disorder)   2. Severe episode of recurrent major depressive disorder, without  psychotic features (HCC)       Donia GuilesJenny Carter Kassel, LCSW 12/08/2015

## 2015-12-08 NOTE — Addendum Note (Signed)
Addended by: Wilder GladeAYLOR, SHAWN V on: 12/08/2015 01:43 PM   Modules accepted: Orders

## 2015-12-11 ENCOUNTER — Other Ambulatory Visit (HOSPITAL_COMMUNITY): Payer: 59 | Admitting: Licensed Clinical Social Worker

## 2015-12-11 DIAGNOSIS — F411 Generalized anxiety disorder: Secondary | ICD-10-CM

## 2015-12-11 DIAGNOSIS — F332 Major depressive disorder, recurrent severe without psychotic features: Secondary | ICD-10-CM

## 2015-12-12 ENCOUNTER — Other Ambulatory Visit (HOSPITAL_COMMUNITY): Payer: Self-pay

## 2015-12-12 ENCOUNTER — Ambulatory Visit (HOSPITAL_COMMUNITY): Payer: Self-pay

## 2015-12-12 NOTE — Psych (Signed)
   [facility]  [name] [id number]  Session Time: 9 AM - 2 PM  Participation Level: Active  Behavioral Response: CasualAlertAnxious  Type of Therapy: Group Therapy  Treatment Goals addressed: Coping  Interventions: CBT, DBT, Supportive and Reframing  Summary: Clinician facilitated check-in regarding current stressors and situation, and review of patient completed diary card. Clinician utilized active listening and empathetic response and validated patient emotions. Clinician facilitated discussion on racing thoughts, control, radical acceptance, and how to utilize skills. Clinician introduced topic of communication and provided psycho-education on non-verbal cues and the 4 types of communication styles. Clinician assessed for immediate needs, medication compliance and efficacy, and safety concerns.    Suicidal/Homicidal: Nowithout intent/plan  Therapist Response: Sarah ShropshireRandall Harned Good is a 25 y.o. female who presents with depression and anxiety symptoms. Patient was within time allowed and reports she is feeling "pretty good." Patient rates her mood at a 6 on a 1-10 scale with 10 being very good. Patient engaged in group activity and discussion. Patient reports understanding of topics discussed. Patient demonstrates continued progress as evidenced by awareness of her symptoms and skills however is inconsistently applying them. Patient denies SI/HI/self-harm thoughts.     Plan: Patient will continue in PHP and medication management. Work towards decreasing anxiety and depression symptoms and increase emotional regulation and positive coping skills.    Diagnosis: GAD (generalized anxiety disorder) [F41.1]    1. GAD (generalized anxiety disorder)   2. Severe episode of recurrent major depressive disorder, without psychotic features (HCC)       Donia GuilesJenny Jayvan Mcshan, LCSW 12/12/2015

## 2015-12-13 ENCOUNTER — Telehealth (HOSPITAL_COMMUNITY): Payer: Self-pay | Admitting: Licensed Clinical Social Worker

## 2015-12-13 ENCOUNTER — Other Ambulatory Visit (HOSPITAL_COMMUNITY): Payer: Self-pay

## 2015-12-14 ENCOUNTER — Ambulatory Visit (HOSPITAL_COMMUNITY): Payer: Self-pay | Admitting: Licensed Clinical Social Worker

## 2015-12-14 ENCOUNTER — Encounter (HOSPITAL_COMMUNITY): Payer: Self-pay | Admitting: Occupational Therapy

## 2015-12-14 ENCOUNTER — Other Ambulatory Visit (HOSPITAL_COMMUNITY): Payer: 59 | Attending: Psychiatry | Admitting: Occupational Therapy

## 2015-12-14 DIAGNOSIS — F411 Generalized anxiety disorder: Secondary | ICD-10-CM | POA: Insufficient documentation

## 2015-12-14 DIAGNOSIS — F331 Major depressive disorder, recurrent, moderate: Secondary | ICD-10-CM | POA: Insufficient documentation

## 2015-12-14 DIAGNOSIS — M545 Low back pain: Secondary | ICD-10-CM | POA: Insufficient documentation

## 2015-12-14 DIAGNOSIS — F332 Major depressive disorder, recurrent severe without psychotic features: Secondary | ICD-10-CM

## 2015-12-14 NOTE — Therapy (Addendum)
[facility] [address] [place], [place], [address] Phone: [phone]   Fax:  [phone]  Occupational Therapy Treatment  Patient Details  Name: Inioluwa Boulay MRN: [id number] Date of Birth: 1990-07-03 Referring Provider: Dr. Neita Garnet  Encounter Date: 12/14/2015      OT End of Session - 12/14/15 1653    Visit Number 4   Number of Visits 6   Date for OT Re-Evaluation 12/19/15   OT Start Time 1030   OT Stop Time 1130   OT Time Calculation (min) 60 min   Activity Tolerance Patient tolerated treatment well   Behavior During Therapy [facility] for tasks assessed/performed      Past Medical History:  Diagnosis Date  . Anxiety   . Migraine   . Mitral valve prolapse     Past Surgical History:  Procedure Laterality Date  . WISDOM TOOTH EXTRACTION      There were no vitals filed for this visit.      Subjective Assessment - 12/14/15 1653    Currently in Pain? No/denies            [facility] OT Assessment - 12/14/15 1653      Assessment   Diagnosis Major Depressive Disorder     Precautions   Precautions None        OT Group: Social and Communication Skills   S:  "I feel pretty confident around people that I'm not trying to impress." O:  Patient actively participated in the following skilled occupational therapy treatment session this date:             Social and communication skills: [name] participated in discussion of social and communication skills including the importance of social skills, what she is good at and what she needs to improve on. [name] engaged in discussion on conversation starters identifying more than 5  potential ideas for beginning a conversation. Also engaged in discussion on body language and  what is portrayed via various gestures and postures. [name] completed Self-esteem alphabet  worksheet identifying good qualities about herself. Also discussed variable that affect  self-esteem. Finally engaged in discussion of assertiveness including confident behavior and reviewed the traits of passive, assertive, and aggressive individuals.  A:  Patient participated in skilled occupational therapy group for social and communication skills this date.  Patient was engaged and open to ideas and strategies introduced.  Pt feels fairly confident in her communication skills with her job, is very confident with friends, feels she may need to work on familial communication.  P:  Continue participation in skilled occupational therapy groups  1-2 times per week for 2 weeks in order to gain the necessary skills needed to return to full time community living and learn effective coping strategies to be a productive community resident. Follow up on HEP social and communication skills.            OT Short Term Goals - 11/30/15 2114      OT SHORT TERM GOAL #1   Title Patient will be educated on strategies to improve psychosocial skills needed to participate fully in all daily, work, and leisure activities.   Time 3   Period Weeks   Status Met     OT SHORT TERM GOAL #2   Title Patient will be educated on a HEP and independent with implementation of HEP.   Time 3   Period Weeks   Status Met     OT SHORT TERM GOAL #3  Title Patient will independently apply psychosocial skills and coping mechanisms to her daily actiivties in order to function independently.   Time 3   Period Weeks   Status Met                  Plan - 12/14/15 1653    Rehab Potential Good   OT Frequency --  1-2x/week   OT Duration --  3 weeks   OT Treatment/Interventions Self-care/ADL training  coping skills development, psychosocial skills development, community reintegration   Consulted and Agree with Plan of Care Patient      Patient will benefit from skilled therapeutic intervention in order to improve the following deficits and impairments:   (psychosocial development, coping skills  development)  Visit Diagnosis: GAD (generalized anxiety disorder)  Severe episode of recurrent major depressive disorder, without psychotic features ([place])    Problem List Patient Active Problem List   Diagnosis Date Noted  . Suicidal ideation 11/19/2015  . Major depressive disorder, recurrent severe without psychotic features ([place]) 11/09/2015  . Pyelonephritis 09/20/2015  . Nausea 09/20/2015  . Leukocytosis 09/20/2015  . Hypokalemia 09/20/2015  . Anxiety 09/20/2015  . Nephrolithiasis    Guadelupe Sabin, OTR/L  [phone] 12/14/2015, 4:54 PM  [facility] [address] [place], [place], [address] Phone: [phone]   Fax:  [phone]  Name: Bradie Sangiovanni MRN: [id number] Date of Birth: [birth year]    OCCUPATIONAL THERAPY DISCHARGE SUMMARY  Visits from Start of Care: 4  Current functional level related to goals / functional outcomes: Successfully applying coping strategies to daily activities. Pt actively participates in group discussions, sharing how she applies strategies in her personal experiences.    Remaining deficits: n/a   Education / Equipment: See above.   Plan: Patient agrees to discharge.  Patient goals were met. Patient is being discharged due to meeting the stated rehab goals.  ?????

## 2015-12-15 ENCOUNTER — Encounter (INDEPENDENT_AMBULATORY_CARE_PROVIDER_SITE_OTHER): Payer: Self-pay

## 2015-12-15 ENCOUNTER — Other Ambulatory Visit (HOSPITAL_COMMUNITY): Payer: 59 | Admitting: Licensed Clinical Social Worker

## 2015-12-15 DIAGNOSIS — F331 Major depressive disorder, recurrent, moderate: Secondary | ICD-10-CM | POA: Diagnosis not present

## 2015-12-15 DIAGNOSIS — F332 Major depressive disorder, recurrent severe without psychotic features: Secondary | ICD-10-CM

## 2015-12-15 DIAGNOSIS — F411 Generalized anxiety disorder: Secondary | ICD-10-CM

## 2015-12-15 NOTE — Psych (Signed)
   [facility]  [name] [id number]  Session Time: 9 AM - 2 PM  Participation Level: Active  Behavioral Response: CasualAlertAnxious  Type of Therapy: Group Therapy  Treatment Goals addressed: Coping  Interventions: CBT, DBT, Supportive and Reframing  Summary: Clinician facilitated check-in regarding current stressors and situation, and review of patient completed diary card. Clinician utilized active listening and empathetic response and validated patient emotions. Clinician facilitated discussion on goal setting and skilfulness. Clinician reviewed topic of communication and introduced active listening skills. Clinician assessed for immediate needs, medication compliance and efficacy, and safety concerns.    Suicidal/Homicidal: Nowithout intent/plan  Therapist Response: [name] is a 25 y.o. female who presents with depression and anxiety symptoms. Patient was within time allowed and reports she is feeling "pretty good." Patient rates her mood at a 2 on a 1-10 scale with 10 being very good. Patient states she has been physically ill the past few days and continues to not feel well. Patient engaged in group activity and discussion. Patient participated in Designer, industrial/productskill practice and role plays. Patient demonstrates continued progress as evidenced by awareness of her symptoms and skills however continues to  inconsistently apply them. Patient denies SI/HI/self-harm thoughts.     Plan: Patient will continue in PHP and medication management. Work towards decreasing anxiety and depression symptoms and increase emotional regulation and positive coping skills.    Diagnosis: GAD (generalized anxiety disorder) [F41.1]    1. GAD (generalized anxiety disorder)   2. Severe episode of recurrent major depressive disorder, without psychotic features (HCC)       [name], LCSW 12/15/2015

## 2015-12-15 NOTE — Psych (Signed)
   [name] [id number]  Session Time: 9 AM - 1 PM  Participation Level: Active  Behavioral Response: CasualAlertAnxious  Type of Therapy: Group Therapy  Treatment Goals addressed: Coping  Interventions: CBT, DBT, Supportive and Reframing  Summary: Clinician facilitated check-in regarding current stressors and situation, and review of patient completed diary card. Clinician utilized active listening and empathetic response and validated patient emotions. Clinician facilitated discussion on disappointments and relationships. Clinician introduced topic of assertiveness and boundaries. Clinician utilized Landscape architecthandout "Bill of Assertive Rights" to facilitate discussion. Clinician assessed for immediate needs, medication compliance and efficacy, and safety concerns.    Suicidal/Homicidal: Nowithout intent/plan  Therapist Response: [name] is a 25 y.o. female who presents with depression and anxiety symptoms. Patient was within time allowed and reports she is feeling "good." Patient rates her mood at a 7 on a 1-10 scale with 10 being very good. Patient engaged in group activity and discussion. Patient identified core beliefs that are barriers to assertiveness and how to begin to challenge those negative thoughts. Patient demonstrates continued progress as evidenced by openness and use of skills at home. Patient denies SI/HI/self-harm thoughts.     Plan: Patient will continue in PHP and medication management. Work towards decreasing anxiety and depression symptoms and increase emotional regulation and positive coping skills.    Diagnosis: GAD (generalized anxiety disorder) [F41.1]    1. GAD (generalized anxiety disorder)   2. Severe episode of recurrent major depressive disorder, without psychotic features (HCC)       Donia GuilesJenny Cayce Quezada, LCSW 12/15/2015

## 2015-12-18 ENCOUNTER — Other Ambulatory Visit (HOSPITAL_COMMUNITY): Payer: 59 | Admitting: Licensed Clinical Social Worker

## 2015-12-18 DIAGNOSIS — F331 Major depressive disorder, recurrent, moderate: Secondary | ICD-10-CM

## 2015-12-18 MED ORDER — GABAPENTIN 600 MG PO TABS: 600.0000 mg | ORAL_TABLET | Freq: Three times a day (TID) | ORAL | 1 refills | Status: DC

## 2015-12-18 NOTE — Progress Notes (Signed)
12/18/15  Psychiatry Progress Note  Duration- 20minutes   Subjective-patient reports he feels she has been making progress, and that she feels significantly  better compared to how she felt prior to going into the inpatient unit, but reports ongoing anxiety, worry as persistent symptoms, only partially alleviated by Vistaril PRNs Mood has been more stable, and acknowledges her mood is better. Denies medication side effects, and feels medications have helped. At this time she [facility] she is working on disposition planning and her current plan is to transition to IOP.   Objective- Patient is presenting fully alert, attentive, well groomed, euthymic, with full range of affect . She reports improved mood but reports ongoing anxiety, frequently worries about different issues . Currently does not endorse significant neuro-vegetative symptoms of depression. Reports she is back living with her roommates and that their relationship is good . Does not endorse medication side effects, but feels medications are not sufficiently addressing her anxiety symptoms. Feels she is benefiting from coping strategies and tools he has learnt in PHP.   ROS-denies headache, chest pain, shortness of breath, nausea or vomiting .   Current Medications- Neurontin 300 mgrs TID, Buspar 15 mgrs TID, Zoloft 200 mgrs QDAY, Trazodone 150 mgrs QHS, Vistaril 25 mgrs Q 8 hours PRN for anxiety   MSE- alert , attentive, wellgroomed, good eye contact,speech normal in tone, volume, rate, mood is improved and today presents euthymic with bright and   full range of affect,no thought disorder, no suicidal or homicidal ideations,nohallucinations, no delusions expressed, does not appear internally preoccupied . At this time future oriented Judgement and Insight intact .Fully oriented x 3.  Assessment- patient presents improved compared to initial presentation-  mood and affect appear  improved and currently  euthymic, with a full range of affect. Reports partially improved but persistent anxiety, which she describes as persistent worrying and episodes of increased anxiety, but no recent full blown panic attacks .No suicidal ideations and future oriented. At [facility] time [facility] planning on transitioning to IOP for further outpatient treatment . We discussed medication options, and agrees to increase Neurontin, which she is tolerating well, without side effects or sedation, in order to further address anxiety ( also describes some mild to moderate lower back pain that might also respond to increased Neurontin dose )     Dx-  MDD, recurrent, no psychotic features, consider GAD  Plan-Completing  PHP this week and working on transitioning to lower level of care ( IOP)  ,   Increase NEURONTIN to 600 mgrs TID, e script sent - side effects reviewed. Cautioned not to drive or perform potentially dangerous chores if sedated. No other medication changes at this time.   Nehemiah MassedFernando Cobos , MD

## 2015-12-19 ENCOUNTER — Ambulatory Visit (HOSPITAL_COMMUNITY): Payer: Self-pay

## 2015-12-19 ENCOUNTER — Other Ambulatory Visit (HOSPITAL_COMMUNITY): Payer: Self-pay

## 2015-12-20 ENCOUNTER — Other Ambulatory Visit (HOSPITAL_COMMUNITY): Payer: Self-pay | Admitting: Psychiatry

## 2015-12-20 ENCOUNTER — Other Ambulatory Visit (HOSPITAL_COMMUNITY): Payer: 59 | Admitting: Psychiatry

## 2015-12-20 ENCOUNTER — Encounter (HOSPITAL_COMMUNITY): Payer: Self-pay | Admitting: Psychiatry

## 2015-12-20 DIAGNOSIS — F331 Major depressive disorder, recurrent, moderate: Secondary | ICD-10-CM
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
## 2015-12-20 NOTE — Progress Notes (Signed)
Patient ID: Sarah Good, female   DOB: March 25, 1990, 25 y.o.   MRN: [id number]  brief transition note from Partial hospital program to IOP  Ms Earley AbideHarned Good has been in inpatient and PHP programs and is now stepping down to IOP.  She believes she needs more support/help than outpatient individual therapy and med management can provide.  She has been diagnosed with major depression and the depression is some better with treatment.  She says currently anxiety is her major concern as she is constantly anxious with anxiety attacks at times.  She wants help on being able to cope with anxiety better.  She says she has been diagnosed with borderline personality disorder in the past and that remains and issue as well.  She and her mother have and up and down relationship and she is not ready to return to work. Diagnosis remains major depression, recurrent moderate.  Add diagnosis of generalized anxiety disorder.  Probable diagnosis of borderline personality disorder  Plan:  Admit to IOP with daily group therapy.  Continue current meds

## 2015-12-20 NOTE — Progress Notes (Signed)
Pt presents from the partial hospitalization program.  Pt denies current SI and states continued depression, anxiety, and trauma symptoms. Pt would like to find new strategies to help her cope with her near constant anxiety. Pt reports having "mad anxiety" and that she often feels "like that moment when you're tilting back in your chair and feel like you'll fall. Except all the time." Pt states symptoms of tightness inchest, panicked feelings, shortness of breath, and racing thoughts. Pt reports having panic attacks mutliple times a week. Pt. states depression symptoms of depressed mood and hopeless and worthless feelings. Pt reports past trauma and trauma symptoms of exagerated startle response and nightmares.  Patient is currently being transitioned from the PHP-IOP to the MH-IOP program.

## 2015-12-21 ENCOUNTER — Other Ambulatory Visit (HOSPITAL_COMMUNITY): Payer: Self-pay

## 2015-12-21 ENCOUNTER — Ambulatory Visit (HOSPITAL_COMMUNITY): Payer: Self-pay | Admitting: Specialist

## 2015-12-21 NOTE — Psych (Signed)
   [facility]  [name] [id number]  Session Time: 9 AM - 2 PM  Participation Level: Active  Behavioral Response: CasualAlertAnxious  Type of Therapy: Group Therapy  Treatment Goals addressed: Coping  Interventions: CBT, DBT, Supportive and Reframing  Summary: Clinician facilitated check-in regarding current stressors and situation, and review of patient completed diary card. Clinician utilized active listening and empathetic response and validated patient emotions. Patient discussed progress and identified specific topics she wanted to work on for the day.  Clinician led group in a mindfulness activity. Clinician allowed patients to discussed pertinent topics during a process session. Patient discussed conflict with a roommate. Clinician and group provided  feedback for how patient could handle situation. Patient stated she felt better about handling the conflict later in the day. Group discussed Positive Psychology. Clinician had clients complete a "What am I Grateful For" sheet and discussed as a group. Group watched Shaun Achor's "The Positive Advantage" Ted-Talk. Group discussed information and ideas from the Ted-Talk. Clinician led group in practicing the five activities to help lead to thinking positively discussed in the video. Patient remembered how much she enjoys meditation after practicing. Clinician challenged patient to add two minutes of meditation into her daily routine.  Clinician assessed for immediate needs, medication compliance and efficacy, and safety concerns. Patient saw doctor.    Suicidal/Homicidal: Nowithout intent/plan  Therapist Response: [name] is a 25 y.o. female who presents with depression and anxiety symptoms. Patient presented within time allowed and in an "irritable" mood. Patient rated herself at a "6" out of 10 at the beginning of group due to conflict with roommate that morning. Patient rated herself at a  "7" at the end of group, stating her anxiety had lessened over the course of group. Patient reports "a lot" of happiness and worry, "a little" sadness and anger, and no feelings of embarrassment over the past 24 hours.  Patient engaged in group activity and discussion. Patient provided specific ways positive psychology, and thinking positively, could help her.  Patient demonstrates continued progress as evidenced by continued participation in group and self-report of increased practice of skills learned within group and decrease in anxiety and depression symptoms. Patient denies any immediate needs.      Plan: Patient will continue in PHP and medication management. Work towards decreasing anxiety and depression symptoms and increase emotional regulation and positive coping skills.    Diagnosis: MDD (major depressive disorder), recurrent episode, moderate (HCC) [F33.1]    1. MDD (major depressive disorder), recurrent episode, moderate (HCC)       Donia GuilesJenny Moxie Kalil, LCSW 12/21/2015

## 2015-12-22 ENCOUNTER — Other Ambulatory Visit (HOSPITAL_COMMUNITY): Payer: 59 | Admitting: Psychiatry

## 2015-12-22 ENCOUNTER — Other Ambulatory Visit (HOSPITAL_COMMUNITY): Payer: Self-pay

## 2015-12-22 DIAGNOSIS — F331 Major depressive disorder, recurrent, moderate: Secondary | ICD-10-CM | POA: Diagnosis not present

## 2015-12-25 ENCOUNTER — Other Ambulatory Visit (HOSPITAL_COMMUNITY): Payer: 59 | Admitting: Psychiatry

## 2015-12-25 ENCOUNTER — Other Ambulatory Visit (HOSPITAL_COMMUNITY): Payer: Self-pay

## 2015-12-25 DIAGNOSIS — F331 Major depressive disorder, recurrent, moderate: Secondary | ICD-10-CM

## 2015-12-25 NOTE — Progress Notes (Signed)
    Daily Group Progress Note  Program: IOP  Group Time: 9:00-12:00   Participation Level: active   Behavioral Response: responsive   Type of Therapy:  group therapy   Summary of Progress:  Pt discussed struggling with communication styles-and expressed that she is often passive and this is frustrating to her. Pt also shared that she is extremely frightened of abandonment and this greatly effects her relationships.

## 2015-12-26 ENCOUNTER — Ambulatory Visit (HOSPITAL_COMMUNITY): Payer: Self-pay

## 2015-12-26 ENCOUNTER — Other Ambulatory Visit (HOSPITAL_COMMUNITY): Payer: 59

## 2015-12-26 ENCOUNTER — Other Ambulatory Visit (HOSPITAL_COMMUNITY): Payer: Self-pay
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
## 2015-12-27 ENCOUNTER — Other Ambulatory Visit (HOSPITAL_COMMUNITY): Payer: 59

## 2015-12-27 ENCOUNTER — Other Ambulatory Visit (HOSPITAL_COMMUNITY): Payer: Self-pay

## 2015-12-28 ENCOUNTER — Ambulatory Visit (HOSPITAL_COMMUNITY): Payer: Self-pay

## 2015-12-28 ENCOUNTER — Other Ambulatory Visit (HOSPITAL_COMMUNITY): Payer: Self-pay
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
## 2015-12-29 ENCOUNTER — Other Ambulatory Visit (HOSPITAL_COMMUNITY): Payer: Self-pay

## 2015-12-29 ENCOUNTER — Other Ambulatory Visit (HOSPITAL_COMMUNITY): Payer: 59

## 2016-01-01 ENCOUNTER — Other Ambulatory Visit (HOSPITAL_COMMUNITY): Payer: 59

## 2016-01-01 ENCOUNTER — Other Ambulatory Visit (HOSPITAL_COMMUNITY): Payer: Self-pay

## 2016-01-02 ENCOUNTER — Encounter (HOSPITAL_COMMUNITY): Payer: Self-pay

## 2016-01-02 ENCOUNTER — Other Ambulatory Visit (HOSPITAL_COMMUNITY): Payer: Self-pay

## 2016-01-02 ENCOUNTER — Other Ambulatory Visit (HOSPITAL_COMMUNITY): Payer: 59 | Admitting: Psychiatry

## 2016-01-02 NOTE — Progress Notes (Signed)
[name] [name] is a 25 y.o., single, Caucasian female who presented from the partial hospitalization program.  Pt denied SI and c/o continued depression, anxiety, and trauma symptoms. Pt wanted to find new strategies to help her cope with her near constant axiety. Patients Currently Reported Symptoms/Problems: Pt reported having "mad anxiety" and that she often feels "like that moment when you're tilting back in your chair and feel like you'll fall. Except all the time." Pt states symptoms of tightness inchest, panicked feelings, shortness of breath, and racing thoughts. Pt reports having panic attacks mutliple times a week. Pt. states depression symptoms of depressed mood and hopeless and worthless feelings. Pt reports past trauma and trauma symptoms of exagerated startle response and nightmares. Patient is currently being transitioned from the PHP-IOP to the MH-IOP program.  A:  D/C pt today due to non-compliancy with attendance.  Pt hasn't been returning writer's calls nor has she returned to MH-IOP.  Pt can call her insurance company to explore approved providers for follow up.      Chestine SporeLARK, RITA, M.Ed, CNA

## 2016-01-02 NOTE — Patient Instructions (Signed)
D:  Discharged pt today due to non-compliancy with attendance.  Pt isn't returning writer's calls nor is she attending MH-IOP.  A:  D/C today.  Pt can call her insurance company for approved outpatient providers.

## 2016-01-03 ENCOUNTER — Other Ambulatory Visit (HOSPITAL_COMMUNITY): Payer: 59

## 2016-01-03 ENCOUNTER — Other Ambulatory Visit (HOSPITAL_COMMUNITY): Payer: Self-pay

## 2016-01-04 ENCOUNTER — Other Ambulatory Visit (HOSPITAL_COMMUNITY): Payer: Self-pay

## 2016-01-05 ENCOUNTER — Other Ambulatory Visit (HOSPITAL_COMMUNITY): Payer: 59

## 2016-01-05 ENCOUNTER — Other Ambulatory Visit (HOSPITAL_COMMUNITY): Payer: Self-pay

## 2016-01-08 ENCOUNTER — Other Ambulatory Visit (HOSPITAL_COMMUNITY): Payer: Self-pay

## 2016-01-08 ENCOUNTER — Other Ambulatory Visit (HOSPITAL_COMMUNITY): Payer: 59

## 2016-01-09 ENCOUNTER — Other Ambulatory Visit (HOSPITAL_COMMUNITY): Payer: 59

## 2016-01-09 ENCOUNTER — Other Ambulatory Visit (HOSPITAL_COMMUNITY): Payer: Self-pay

## 2016-01-10 ENCOUNTER — Other Ambulatory Visit (HOSPITAL_COMMUNITY): Payer: 59

## 2016-01-11 ENCOUNTER — Other Ambulatory Visit (HOSPITAL_COMMUNITY): Payer: 59

## 2016-01-12 ENCOUNTER — Other Ambulatory Visit (HOSPITAL_COMMUNITY): Payer: 59

## 2016-01-15 ENCOUNTER — Other Ambulatory Visit (HOSPITAL_COMMUNITY): Payer: 59

## 2016-01-16 ENCOUNTER — Other Ambulatory Visit (HOSPITAL_COMMUNITY): Payer: 59

## 2016-01-17 ENCOUNTER — Other Ambulatory Visit (HOSPITAL_COMMUNITY): Payer: 59

## 2016-01-18 ENCOUNTER — Other Ambulatory Visit (HOSPITAL_COMMUNITY): Payer: 59

## 2016-01-19 ENCOUNTER — Other Ambulatory Visit (HOSPITAL_COMMUNITY): Payer: 59

## 2016-01-22 ENCOUNTER — Other Ambulatory Visit (HOSPITAL_COMMUNITY): Payer: 59

## 2016-01-23 ENCOUNTER — Other Ambulatory Visit (HOSPITAL_COMMUNITY): Payer: 59

## 2016-01-24 ENCOUNTER — Other Ambulatory Visit (HOSPITAL_COMMUNITY): Payer: 59

## 2016-01-25 ENCOUNTER — Other Ambulatory Visit (HOSPITAL_COMMUNITY): Payer: 59

## 2016-01-26 ENCOUNTER — Other Ambulatory Visit (HOSPITAL_COMMUNITY): Payer: 59

## 2016-01-29 ENCOUNTER — Other Ambulatory Visit (HOSPITAL_COMMUNITY): Payer: 59

## 2016-01-30 ENCOUNTER — Other Ambulatory Visit (HOSPITAL_COMMUNITY): Payer: 59

## 2016-01-31 ENCOUNTER — Other Ambulatory Visit (HOSPITAL_COMMUNITY): Payer: 59

## 2016-02-01 ENCOUNTER — Other Ambulatory Visit (HOSPITAL_COMMUNITY): Payer: 59

## 2016-02-02 ENCOUNTER — Other Ambulatory Visit (HOSPITAL_COMMUNITY): Payer: 59

## 2016-02-06 ENCOUNTER — Other Ambulatory Visit (HOSPITAL_COMMUNITY): Payer: 59

## 2016-02-07 ENCOUNTER — Other Ambulatory Visit (HOSPITAL_COMMUNITY): Payer: 59

## 2016-02-08 ENCOUNTER — Other Ambulatory Visit (HOSPITAL_COMMUNITY): Payer: 59

## 2016-02-09 ENCOUNTER — Other Ambulatory Visit (HOSPITAL_COMMUNITY): Payer: 59

## 2016-02-13 ENCOUNTER — Other Ambulatory Visit (HOSPITAL_COMMUNITY): Payer: 59

## 2016-06-20 ENCOUNTER — Emergency Department (HOSPITAL_COMMUNITY)
Admission: EM | Admit: 2016-06-20 | Discharge: 2016-06-21 | Disposition: A | Discharge status: Other Acute Inpatient Hospital | Payer: 59 | Attending: Emergency Medicine | Admitting: Emergency Medicine

## 2016-06-20 ENCOUNTER — Encounter (HOSPITAL_COMMUNITY): Payer: Self-pay | Admitting: *Deleted

## 2016-06-20 DIAGNOSIS — R45851 Suicidal ideations: Secondary | ICD-10-CM | POA: Insufficient documentation

## 2016-06-20 DIAGNOSIS — Z79899 Other long term (current) drug therapy: Secondary | ICD-10-CM | POA: Insufficient documentation

## 2016-06-20 DIAGNOSIS — F322 Major depressive disorder, single episode, severe without psychotic features: Secondary | ICD-10-CM | POA: Diagnosis present

## 2016-06-20 DIAGNOSIS — F332 Major depressive disorder, recurrent severe without psychotic features: Secondary | ICD-10-CM

## 2016-06-20 LAB — CBC
HCT: 42.8 % (ref 36.0–46.0)
Hemoglobin: 14.9 g/dL (ref 12.0–15.0)
MCH: 30.3 pg (ref 26.0–34.0)
MCHC: 34.8 g/dL (ref 30.0–36.0)
MCV: 87.2 fL (ref 78.0–100.0)
PLATELETS: 342 10*3/uL (ref 150–400)
RBC: 4.91 MIL/uL (ref 3.87–5.11)
RDW: 12.2 % (ref 11.5–15.5)
WBC: 10.8 10*3/uL — AB (ref 4.0–10.5)

## 2016-06-20 LAB — URINALYSIS, ROUTINE W REFLEX MICROSCOPIC
BILIRUBIN URINE: NEGATIVE
GLUCOSE, UA: NEGATIVE mg/dL
Ketones, ur: NEGATIVE mg/dL
LEUKOCYTES UA: NEGATIVE
Nitrite: NEGATIVE
PH: 6 (ref 5.0–8.0)
PROTEIN: NEGATIVE mg/dL
SPECIFIC GRAVITY, URINE: 1.009 (ref 1.005–1.030)

## 2016-06-20 LAB — COMPREHENSIVE METABOLIC PANEL
ALT: 20 U/L (ref 14–54)
ANION GAP: 8 (ref 5–15)
AST: 20 U/L (ref 15–41)
Albumin: 4.3 g/dL (ref 3.5–5.0)
Alkaline Phosphatase: 60 U/L (ref 38–126)
BILIRUBIN TOTAL: 0.2 mg/dL — AB (ref 0.3–1.2)
BUN: 5 mg/dL — ABNORMAL LOW (ref 6–20)
CHLORIDE: 106 mmol/L (ref 101–111)
CO2: 26 mmol/L (ref 22–32)
Calcium: 9 mg/dL (ref 8.9–10.3)
Creatinine, Ser: 0.64 mg/dL (ref 0.44–1.00)
Glucose, Bld: 111 mg/dL — ABNORMAL HIGH (ref 65–99)
POTASSIUM: 3.6 mmol/L (ref 3.5–5.1)
Sodium: 140 mmol/L (ref 135–145)
TOTAL PROTEIN: 6.9 g/dL (ref 6.5–8.1)

## 2016-06-20 LAB — RAPID URINE DRUG SCREEN, HOSP PERFORMED
AMPHETAMINES: NOT DETECTED
Barbiturates: NOT DETECTED
Benzodiazepines: NOT DETECTED
Cocaine: NOT DETECTED
Opiates: NOT DETECTED
TETRAHYDROCANNABINOL: NOT DETECTED

## 2016-06-20 LAB — SALICYLATE LEVEL

## 2016-06-20 LAB — ACETAMINOPHEN LEVEL: Acetaminophen (Tylenol), Serum: <10 ug/mL — ABNORMAL LOW (ref 10–30)

## 2016-06-20 LAB — ETHANOL

## 2016-06-20 LAB — PREGNANCY, URINE: PREG TEST UR: NEGATIVE

## 2016-06-20 MED ORDER — LORATADINE 10 MG PO TABS
10.0000 mg | ORAL_TABLET | Freq: Once | ORAL | Status: AC
  Administered 2016-06-20: 10 mg via ORAL
  Filled 2016-06-20: qty 1

## 2016-06-20 MED ORDER — IBUPROFEN 200 MG PO TABS
600.0000 mg | ORAL_TABLET | Freq: Three times a day (TID) | ORAL | Status: DC | PRN
  Administered 2016-06-20: 600 mg via ORAL
  Filled 2016-06-20: qty 1

## 2016-06-20 MED ORDER — LORAZEPAM 1 MG PO TABS
1.0000 mg | ORAL_TABLET | Freq: Three times a day (TID) | ORAL | Status: DC | PRN
Start: 2016-06-20 — End: 2016-06-21

## 2016-06-20 MED ORDER — ONDANSETRON HCL 4 MG PO TABS: 4.0000 mg | ORAL_TABLET | Freq: Three times a day (TID) | ORAL | Status: DC | PRN

## 2016-06-20 NOTE — ED Triage Notes (Signed)
The pt reports  That  She wants to kill herself  She was going to drown herself earlier today but did not do it tearful in triage  lmp  None iud

## 2016-06-20 NOTE — ED Notes (Signed)
Pt given snack and water to drink   

## 2016-06-20 NOTE — ED Notes (Signed)
A snack and drink placed at patient bedside, and A Regular Diet ordered for Dinner.

## 2016-06-20 NOTE — ED Notes (Signed)
Dinner tray at bedside
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
## 2016-06-21 ENCOUNTER — Encounter: Payer: Self-pay | Admitting: Psychiatry

## 2016-06-21 ENCOUNTER — Inpatient Hospital Stay
Admit: 2016-06-21 | Discharge: 2016-07-01 | DRG: 885 | Disposition: A | Discharge status: Home / Self Care | Payer: 59 | Source: Intra-hospital | Attending: Psychiatry | Admitting: Psychiatry

## 2016-06-21 DIAGNOSIS — E538 Deficiency of other specified B group vitamins: Secondary | ICD-10-CM | POA: Diagnosis present

## 2016-06-21 DIAGNOSIS — Z975 Presence of (intrauterine) contraceptive device: Secondary | ICD-10-CM | POA: Diagnosis not present

## 2016-06-21 DIAGNOSIS — F4310 Post-traumatic stress disorder, unspecified: Secondary | ICD-10-CM | POA: Diagnosis present

## 2016-06-21 DIAGNOSIS — Z62810 Personal history of physical and sexual abuse in childhood: Secondary | ICD-10-CM | POA: Diagnosis present

## 2016-06-21 DIAGNOSIS — E559 Vitamin D deficiency, unspecified: Secondary | ICD-10-CM | POA: Diagnosis present

## 2016-06-21 DIAGNOSIS — F603 Borderline personality disorder: Secondary | ICD-10-CM

## 2016-06-21 DIAGNOSIS — G4700 Insomnia, unspecified: Secondary | ICD-10-CM | POA: Diagnosis present

## 2016-06-21 DIAGNOSIS — Z79899 Other long term (current) drug therapy: Secondary | ICD-10-CM | POA: Diagnosis not present

## 2016-06-21 DIAGNOSIS — Z9119 Patient's noncompliance with other medical treatment and regimen: Secondary | ICD-10-CM | POA: Diagnosis not present

## 2016-06-21 DIAGNOSIS — G43909 Migraine, unspecified, not intractable, without status migrainosus: Secondary | ICD-10-CM | POA: Diagnosis not present

## 2016-06-21 DIAGNOSIS — Z915 Personal history of self-harm: Secondary | ICD-10-CM

## 2016-06-21 DIAGNOSIS — Z560 Unemployment, unspecified: Secondary | ICD-10-CM | POA: Diagnosis not present

## 2016-06-21 DIAGNOSIS — I341 Nonrheumatic mitral (valve) prolapse: Secondary | ICD-10-CM | POA: Diagnosis present

## 2016-06-21 DIAGNOSIS — F411 Generalized anxiety disorder: Secondary | ICD-10-CM | POA: Diagnosis present

## 2016-06-21 DIAGNOSIS — F332 Major depressive disorder, recurrent severe without psychotic features: Principal | ICD-10-CM | POA: Diagnosis present

## 2016-06-21 DIAGNOSIS — J302 Other seasonal allergic rhinitis: Secondary | ICD-10-CM | POA: Diagnosis present

## 2016-06-21 DIAGNOSIS — E669 Obesity, unspecified: Secondary | ICD-10-CM | POA: Diagnosis present

## 2016-06-21 DIAGNOSIS — Z6830 Body mass index (BMI) 30.0-30.9, adult: Secondary | ICD-10-CM | POA: Diagnosis not present

## 2016-06-21 DIAGNOSIS — R45851 Suicidal ideations: Secondary | ICD-10-CM | POA: Diagnosis present

## 2016-06-21 DIAGNOSIS — F410 Panic disorder [episodic paroxysmal anxiety] without agoraphobia: Secondary | ICD-10-CM | POA: Diagnosis present

## 2016-06-21 MED ORDER — MAGNESIUM HYDROXIDE 400 MG/5ML PO SUSP: 30.0000 mL | Freq: Every day | ORAL | Status: DC | PRN

## 2016-06-21 MED ORDER — TRAZODONE HCL 100 MG PO TABS
100.0000 mg | ORAL_TABLET | Freq: Every evening | ORAL | Status: DC | PRN
  Administered 2016-06-21: 100 mg via ORAL
  Filled 2016-06-21: qty 1

## 2016-06-21 MED ORDER — ACETAMINOPHEN 325 MG PO TABS
650.0000 mg | ORAL_TABLET | Freq: Four times a day (QID) | ORAL | Status: DC | PRN
  Administered 2016-06-21 – 2016-06-29 (×3): 650 mg via ORAL
  Filled 2016-06-21 (×4): qty 2

## 2016-06-21 MED ORDER — LORATADINE 10 MG PO TABS
10.0000 mg | ORAL_TABLET | Freq: Every day | ORAL | Status: DC
  Administered 2016-06-21 – 2016-07-01 (×11): 10 mg via ORAL
  Filled 2016-06-21 (×11): qty 1

## 2016-06-21 MED ORDER — ALUM & MAG HYDROXIDE-SIMETH 200-200-20 MG/5ML PO SUSP: 30.0000 mL | ORAL | Status: DC | PRN

## 2016-06-21 NOTE — Progress Notes (Signed)
D: Pt SI- contracts for safety, denies HI/ AVH. Pt is pleasant and cooperative. Pt pt stated she felt not great, but felt safe. Pt recognized Clinical research [facility]writer [facility] and felt comfortable talking. Pt seen on the unit getting acclimated to the unit.   A: Pt was offered support and encouragement. Pt was given scheduled medications. Pt was encourage to attend groups. Q 15 minute checks were done for safety.   R:Pt attends groups and interacts well with peers and staff. Pt is taking medication. Pt has no complaints at this time .Pt receptive to treatment and safety maintained on unit.

## 2016-06-21 NOTE — Plan of Care (Signed)
Problem: Safety: Goal: Periods of time without injury will increase Outcome: Progressing Pt safe on the unit   
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
## 2016-06-21 NOTE — Progress Notes (Signed)
Per [facility] Somalvin [facility], [facility]; accepting provider Dr. Jennet MaduroPucilowska. Pt is [place]C'd and [place]. Please call report to [phone].  Vernie ShanksLauren Doy Taaffe, LCSW Clinical Social Work [phone]

## 2016-06-21 NOTE — ED Notes (Signed)
[facility], Charity fundraiserN at [facility]

## 2016-06-21 NOTE — ED Notes (Signed)
Patient was given a snack and A regular diet was ordered for Lunch.
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
## 2016-06-22 DIAGNOSIS — J302 Other seasonal allergic rhinitis: Secondary | ICD-10-CM

## 2016-06-22 DIAGNOSIS — F603 Borderline personality disorder: Secondary | ICD-10-CM

## 2016-06-22 DIAGNOSIS — F332 Major depressive disorder, recurrent severe without psychotic features: Principal | ICD-10-CM

## 2016-06-22 DIAGNOSIS — F4310 Post-traumatic stress disorder, unspecified: Secondary | ICD-10-CM

## 2016-06-22 DIAGNOSIS — E559 Vitamin D deficiency, unspecified: Secondary | ICD-10-CM

## 2016-06-22 MED ORDER — TRAZODONE HCL 50 MG PO TABS
150.0000 mg | ORAL_TABLET | Freq: Every day | ORAL | Status: DC
  Administered 2016-06-22 – 2016-06-30 (×9): 150 mg via ORAL
  Filled 2016-06-22 (×9): qty 1

## 2016-06-22 MED ORDER — CALCIUM CITRATE-VITAMIN D 500-400 MG-UNIT PO CHEW
2.0000 | CHEWABLE_TABLET | Freq: Every day | ORAL | Status: DC
  Filled 2016-06-22: qty 2

## 2016-06-22 MED ORDER — CALCIUM CITRATE-VITAMIN D 500-500 MG-UNIT PO CHEW
2.0000 | CHEWABLE_TABLET | Freq: Every day | ORAL | Status: DC
  Administered 2016-06-22: 2 via ORAL
  Administered 2016-06-23: 1 via ORAL
  Administered 2016-06-24 – 2016-07-01 (×8): 2 via ORAL
  Filled 2016-06-22 (×11): qty 2

## 2016-06-22 MED ORDER — VENLAFAXINE HCL ER 37.5 MG PO CP24
37.5000 mg | ORAL_CAPSULE | Freq: Every day | ORAL | Status: DC
  Administered 2016-06-23 – 2016-07-01 (×9): 37.5 mg via ORAL
  Filled 2016-06-22 (×10): qty 1

## 2016-06-22 MED ORDER — GABAPENTIN 600 MG PO TABS
300.0000 mg | ORAL_TABLET | Freq: Three times a day (TID) | ORAL | Status: DC
  Administered 2016-06-22 – 2016-06-25 (×10): 300 mg via ORAL
  Filled 2016-06-22 (×9): qty 1
  Filled 2016-06-22: qty 2

## 2016-06-22 NOTE — [facility] Group Notes (Signed)
[facility] LCSW Group Therapy Note  Date/Time: 06/22/16, 1300  Type of Therapy/Topic:  Group Therapy:  Balance in Life  Participation Level:  active  Description of Group:    This group will address the concept of balance and how it feels and looks when one is unbalanced. Patients will be encouraged to process areas in their lives that are out of balance, and identify reasons for remaining unbalanced. Facilitators will guide patients utilizing problem- solving interventions to address and correct the stressor making their life unbalanced. Understanding and applying boundaries will be explored and addressed for obtaining  and maintaining a balanced life. Patients will be encouraged to explore ways to assertively make their unbalanced needs known to significant others in their lives, using other group members and facilitator for support and feedback.  Therapeutic Goals: 1. Patient will identify two or more emotions or situations they have that consume much of in their lives. 2. Patient will identify signs/triggers that life has become out of balance:  3. Patient will identify two ways to set boundaries in order to achieve balance in their lives:  4. Patient will demonstrate ability to communicate their needs through discussion and/or role plays  Summary of Patient Progress:  Pt identified mental/emotional and financial as areas of her life that are out of balance.  Pt also shared with the group her current situation and how her lack of transportation is making it difficult to get treatment and to obtain employment.  Good participation, very engaged.          Therapeutic Modalities:   Cognitive Behavioral Therapy Solution-Focused Therapy Assertiveness Training  Daleen SquibbGreg Senaya Dicenso, Kentucky[place]CS[place]
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
## 2016-06-22 NOTE — [facility] Group Notes (Signed)
[facility] Group Notes:  (Nursing/MHT/Case Management/Adjunct)  Date:  06/22/2016  Time:  11:10 PM  Type of Therapy:  Group Therapy  Participation Level:  Active  Participation Quality:  Appropriate  Affect:  Appropriate  Cognitive:  Alert  Insight:  Appropriate  Engagement in Group:  Engaged  Modes of Intervention:  Support  Summary of Progress/Problems:  Sarah Good 06/22/2016, 11:10 PM

## 2016-06-22 NOTE — [facility] Suicide Risk Assessment (Signed)
[facility] INPATIENT:  Family/Significant Other Suicide Prevention Education  Suicide Prevention Education:  Contact Attempts: Julieanne MansonScott [name], father, [phone],has been identified by the patient as the family member/significant other with whom the patient will be residing, and identified as the person(s) who will aid the patient in the event of a mental health crisis.  With written consent from the patient, two attempts were made to provide suicide prevention education, prior to and/or following the patient's discharge.  We were unsuccessful in providing suicide prevention education.  A suicide education pamphlet was given to the patient to share with family/significant other.  Date and time of first attempt:06/22/16, 1542 Date and time of second attempt:  Lorri FrederickWierda, Fiza Nation Jon, LCSW 06/22/2016, 3:43 PM

## 2016-06-22 NOTE — Progress Notes (Signed)
D: Patient stated sleptfairlast night .Stated appetite fair and energy level low. Stated concentration is poor. Stated on Depression scale  10, hopeless 10  and anxiety 8 .( low 0-10 high).  Denies suicidal  homicidal ideations  .  No auditory hallucinations  No pain concerns . Appropriate ADL'S. Interacting with peers and staff. Attending unit programing  A: Encourage patient participation with unit programming . Instruction  Given on  Medication , verbalize understanding. R: Voice no other concerns. Staff continue to monitor

## 2016-06-22 NOTE — Progress Notes (Signed)
Adult Psychoeducational Group Note  Date:  06/22/2016 Time:  12:06 AM  Group Topic/Focus:  Wrap-Up Group:   The focus of this group is to help patients review their daily goal of treatment and discuss progress on daily workbooks.  Participation Level:  Active  Participation Quality:  Appropriate, Attentive, Sharing and Supportive  Affect:  Appropriate  Cognitive:  Alert, Appropriate and Oriented  Insight: Good  Engagement in Group:  Engaged and Supportive  Modes of Intervention:  Discussion and Exploration  Additional Comments:     Foy GuadalajaraJasmine R Mariafernanda Hendricksen 06/22/2016, 12:06 AM

## 2016-06-22 NOTE — Progress Notes (Signed)
[name] spent time in the dayroom and was active in conversation with peers.  She was pleasant and cooperative on contact.  She asked appropriate questions about medications and was given information and support.

## 2016-06-22 NOTE — Plan of Care (Signed)
Problem: Coping: Goal: Ability to identify and develop effective coping behavior will improve Outcome: Progressing Working on coping skills .    
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
## 2016-06-22 NOTE — Plan of Care (Signed)
Problem: Coping: Goal: Ability to cope will improve Outcome: Progressing Patient spent most of this evening in the dayroom with peers.

## 2016-06-22 NOTE — [facility] Suicide Risk Assessment (Addendum)
[facility]   Nursing information obtained from:    Demographic factors:    Current Mental Status:    Loss Factors:    Historical Factors:    Risk Reduction Factors:     Total Time spent with patient:  Principal Problem: <principal problem not specified> Diagnosis:   Patient Active Problem List   Diagnosis Date Noted  . MDD (major depressive disorder) [F32.9] 06/21/2016  . Major depressive disorder, recurrent severe without psychotic features (HCC) [F33.2] 11/09/2015   Subjective Data:   Continued Clinical Symptoms:  Alcohol Use Disorder Identification Test Final Score (AUDIT): 1 The "Alcohol Use Disorders Identification Test", Guidelines for Use in Primary Care, Second Edition.  World Science writerHealth Organization [facility](WHO). Score between 0-7:  no or low risk or alcohol related problems. Score between 8-15:  moderate risk of alcohol related problems. Score between 16-19:  high risk of alcohol related problems. Score 20 or above:  warrants further diagnostic evaluation for alcohol dependence and treatment.   CLINICAL FACTORS:   Severe Anxiety and/or Agitation Depression:   Impulsivity Insomnia Personality Disorders:   Cluster B Comorbid depression More than one psychiatric diagnosis Previous Psychiatric Diagnoses and Treatments    Psychiatric Specialty Exam: Physical Exam  ROS  Blood pressure (!) 110/56, pulse 72, temperature 98.2 F (36.8 C), resp. rate 18, weight 79.4 kg (175 lb), SpO2 100 %.Body mass index is 30.04 kg/m.                                                    Sleep:  Number of Hours: 7.45      COGNITIVE FEATURES THAT CONTRIBUTE TO RISK:  Polarized thinking    SUICIDE RISK:   Moderate:  Frequent suicidal ideation with limited intensity, and duration, some specificity in terms of plans, no associated intent, good self-control, limited dysphoria/symptomatology, some risk factors present, and identifiable  protective factors, including available and accessible social support.  PLAN OF CARE: admit  I certify that inpatient services furnished can reasonably be expected to improve the patient's condition.   Jimmy FootmanHernandez-Gonzalez,  Shalandria Elsbernd, MD 06/22/2016, 1:25 PM

## 2016-06-23 LAB — VITAMIN B12: Vitamin B-12: 264 pg/mL (ref 180–914)

## 2016-06-23 LAB — TSH: TSH: 1.014 u[IU]/mL (ref 0.350–4.500)

## 2016-06-23 NOTE — [facility] Suicide Risk Assessment (Signed)
[facility] INPATIENT:  Family/Significant Other Suicide Prevention Education  Suicide Prevention Education:  Education Completed; Julieanne MansonScott [name], father, [phone] has been identified by the patient as the family member/significant other with whom the patient will be residing, and identified as the person(s) who will aid the patient in the event of a mental health crisis (suicidal ideations/suicide attempt).  With written consent from the patient, the family member/significant other has been provided the following suicide prevention education, prior to the and/or following the discharge of the patient.  The suicide prevention education provided includes the following:  Suicide risk factors  Suicide prevention and interventions  National Suicide Hotline telephone number  [facility] assessment telephone number  [facility] Emergency Assistance 911  [facility] and/or Residential Mobile Crisis Unit telephone number  Request made of family/significant other to:  Remove weapons (e.g., guns, rifles, knives), all items previously/currently identified as safety concern.    Remove drugs/medications (over-the-counter, prescriptions, illicit drugs), all items previously/currently identified as a safety concern.  The family member/significant other verbalizes understanding of the suicide prevention education information provided.  The family member/significant other agrees to remove the items of safety concern listed above.  Lynden OxfordKadijah R Cierrah Dace, MSW, LCSW-A 06/23/2016, 10:58 AM

## 2016-06-23 NOTE — Progress Notes (Signed)
D: Pt, who likes to be called Sarah Good, denied SI, HI, and AVH. On her self-inventory form, she reported fair sleep, fair appetite, normal energy level, and poor concentration. She rated her depression 9/10, hopelessness 10/10, and anxiety 9/10. She wrote that her goal is to go to groups. On the unit, she has been calm, appropriate, and pleasant. She had a brief dizzy spell when her blood was drawn for a lab specimen, but that quickly resolved after she sat down and drank some juice.   A: Meds given as ordered. No PRNs requested or required. Q15 safety checks maintained. Support/encouragement offered.  R: Pt remains free from harm and continues with treatment. Will continue to monitor for needs/safety.

## 2016-06-23 NOTE — Plan of Care (Signed)
Problem: Coping: Goal: Ability to cope will improve Outcome: Progressing [name] engages with peers and staff in a friendly manner.

## 2016-06-23 NOTE — Progress Notes (Signed)
Sarah HeckRandy spent this evening with peers or talking on the phone.  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  She identifies wanting to read more than watch television.  She denies SI/HI/AVH and contracts for safety.

## 2016-06-23 NOTE — [facility] Suicide Risk Assessment (Signed)
[facility] INPATIENT:  Family/Significant Other Suicide Prevention Education  Suicide Prevention Education:  Contact Attempts: Julieanne MansonScott [name], father, [phone] has been identified by the patient as the family member/significant other with whom the patient will be residing, and identified as the person(s) who will aid the patient in the event of a mental health crisis.  With written consent from the patient, two attempts were made to provide suicide prevention education, prior to and/or following the patient's discharge.  We were unsuccessful in providing suicide prevention education.  A suicide education pamphlet was given to the patient to share with family/significant other.  Date and time of second attempt: 06/23/2016; 9:51 AM  Sarah OxfordKadijah R Ayaka Good, MSW, LCSW-A 06/23/2016, 9:52 AM

## 2016-06-23 NOTE — [facility] Group Notes (Signed)
[facility] LCSW Group Therapy   06/23/2016 1 PM   Type of Therapy: Group Therapy   Participation Level: Active   Participation Quality: Attentive, Sharing and Supportive   Affect: Appropriate   Cognitive: Alert and Oriented   Insight: Developing/Improving and Engaged   Engagement in Therapy: Developing/Improving and Engaged   Modes of Intervention: Clarification, Confrontation, Discussion, Education, Exploration, Limit-setting, Orientation, Problem-solving, Rapport Building, Dance movement psychotherapisteality Testing, Socialization and Support   Summary of Progress/Problems: The topic for group today was support or lack of support and how this affects recovery. This group focused on both positive and negative aspects of support or lack therof and allowed  group members to process ways to cope with negative emotions by discussing their coping mechanisms for a lack of support. Group members were asked to reflect on a time when their reaction to a lack of support led to a negative outcome and explored how using coping mechanisms to regulate their emotions had benefited them. Group members were also asked to discuss a time when emotion regulation was utilized when they felt overwhelmed.     Hampton AbbotKadijah Rashea Hoskie, MSW, LCSW-A 06/23/2016, 3:01PM
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
## 2016-06-24 MED ORDER — MECLIZINE HCL 12.5 MG PO TABS
12.5000 mg | ORAL_TABLET | Freq: Three times a day (TID) | ORAL | Status: DC | PRN
  Administered 2016-06-24 – 2016-06-29 (×2): 12.5 mg via ORAL
  Filled 2016-06-24 (×3): qty 1

## 2016-06-24 MED ORDER — FLUTICASONE PROPIONATE 50 MCG/ACT NA SUSP
2.0000 | Freq: Every day | NASAL | Status: DC
  Administered 2016-06-24 – 2016-07-01 (×8): 2 via NASAL
  Filled 2016-06-24: qty 16

## 2016-06-24 NOTE — [facility] Group Notes (Signed)
[facility] Group Notes:  (Nursing/MHT/Case Management/Adjunct)  Date:  06/24/2016  Time:  4:00 PM  Type of Therapy:  Psychoeducational Skills  Participation Level:  Active  Participation Quality:  Appropriate, Attentive and Supportive  Affect:  Appropriate  Cognitive:  Appropriate  Insight:  Appropriate  Engagement in Group:  Engaged and Supportive  Modes of Intervention:  Discussion, Education and Exploration  Summary of Progress/Problems:  Twanna Hymanda C Arrin Ishler 06/24/2016, 4:00 PM
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
## 2016-06-24 NOTE — Plan of Care (Signed)
Problem: Health Behavior/Discharge Planning: Goal: Compliance with treatment plan for underlying cause of condition will improve Outcome: Progressing Patient compliant with medications, attends group

## 2016-06-24 NOTE — [facility] Group Notes (Signed)
[facility] Group Notes:  (Nursing/MHT/Case Management/Adjunct)  Date:  06/24/2016  Time:  3:05 AM  Type of Therapy:  Group Therapy  Participation Level:  Active  Participation Quality:  Appropriate  Affect:  Appropriate  Cognitive:  Appropriate  Insight:  Appropriate  Engagement in Group:  Engaged  Modes of Intervention:  Discussion  Summary of Progress/Problems:  Burt EkJanice Marie Anaily Ashbaugh 06/24/2016, 3:05 AM
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
## 2016-06-24 NOTE — Tx Team (Signed)
Interdisciplinary Treatment and Diagnostic Plan Update  06/24/2016 Time of Session: 1135 Sarah Good MRN: [id number]  Principal Diagnosis: Major depressive disorder, recurrent severe without psychotic features (HCC)  Secondary Diagnoses: Principal Problem:   Major depressive disorder, recurrent severe without psychotic features (HCC) Active Problems:   PTSD (post-traumatic stress disorder)   Borderline personality disorder   Vitamin D deficiency   Seasonal allergies   Current Medications:  Current Facility-Administered Medications  Medication Dose Route Frequency Provider Last Rate Last Dose  . acetaminophen (TYLENOL) tablet 650 mg  650 mg Oral Q6H PRN Jimmy Footman, MD   650 mg at 06/22/16 1620  . alum & mag hydroxide-simeth (MAALOX/MYLANTA) 200-200-20 MG/5ML suspension 30 mL  30 mL Oral Q4H PRN Jimmy Footman, MD      . calcium citrate-vitamin D 500-500 MG-UNIT per chewable tablet 2 tablet  2 tablet Oral Daily Jimmy Footman, MD   2 tablet at 06/24/16 4098  . fluticasone (FLONASE) 50 MCG/ACT nasal spray 2 spray  2 spray Each Nare Daily Jimmy Footman, MD   2 spray at 06/24/16 1402  . gabapentin (NEURONTIN) tablet 300 mg  300 mg Oral TID Jimmy Footman, MD   300 mg at 06/24/16 1206  . loratadine (CLARITIN) tablet 10 mg  10 mg Oral Daily Jimmy Footman, MD   10 mg at 06/24/16 0820  . magnesium hydroxide (MILK OF MAGNESIA) suspension 30 mL  30 mL Oral Daily PRN Jimmy Footman, MD      . meclizine (ANTIVERT) tablet 12.5 mg  12.5 mg Oral TID PRN Jimmy Footman, MD   12.5 mg at 06/24/16 1400  . traZODone (DESYREL) tablet 150 mg  150 mg Oral QHS Jimmy Footman, MD   150 mg at 06/23/16 2144  . venlafaxine XR (EFFEXOR-XR) 24 hr capsule 37.5 mg  37.5 mg Oral Q breakfast Jimmy Footman, MD   37.5 mg at 06/24/16 0820   PTA Medications: Prescriptions Prior to Admission   Medication Sig Dispense Refill Last Dose  . fexofenadine (ALLEGRA) 180 MG tablet Take 180 mg by mouth daily.   06/19/2016 at unknown  . levonorgestrel (MIRENA) 20 MCG/24HR IUD 1 each by Intrauterine route once.   06/20/2016 at unknown    Patient Stressors:    Patient Strengths:    Treatment Modalities: Medication Management, Group therapy, Case management,  1 to 1 session with clinician, Psychoeducation, Recreational therapy.   Physician Treatment Plan for Primary Diagnosis: Major depressive disorder, recurrent severe without psychotic features (HCC) Long Term Goal(s): Improvement in symptoms so as ready for discharge Improvement in symptoms so as ready for discharge   Short Term Goals: Ability to verbalize feelings will improve Ability to disclose and discuss suicidal ideas Ability to identify and develop effective coping behaviors will improve Ability to identify changes in lifestyle to reduce recurrence of condition will improve Compliance with prescribed medications will improve  Medication Management: Evaluate patient's response, side effects, and tolerance of medication regimen.  Therapeutic Interventions: 1 to 1 sessions, Unit Group sessions and Medication administration.  Evaluation of Outcomes: Progressing  Physician Treatment Plan for Secondary Diagnosis: Principal Problem:   Major depressive disorder, recurrent severe without psychotic features (HCC) Active Problems:   PTSD (post-traumatic stress disorder)   Borderline personality disorder   Vitamin D deficiency   Seasonal allergies  Long Term Goal(s): Improvement in symptoms so as ready for discharge Improvement in symptoms so as ready for discharge   Short Term Goals: Ability to verbalize feelings will improve Ability to disclose and discuss suicidal  ideas Ability to identify and develop effective coping behaviors will improve Ability to identify changes in lifestyle to reduce recurrence of condition will  improve Compliance with prescribed medications will improve     Medication Management: Evaluate patient's response, side effects, and tolerance of medication regimen.  Therapeutic Interventions: 1 to 1 sessions, Unit Group sessions and Medication administration.  Evaluation of Outcomes: Progressing   RN Treatment Plan for Primary Diagnosis: Major depressive disorder, recurrent severe without psychotic features (HCC) Long Term Goal(s): Knowledge of disease and therapeutic regimen to maintain health will improve  Short Term Goals: Ability to verbalize feelings will improve, Ability to disclose and discuss suicidal ideas, Ability to identify and develop effective coping behaviors will improve and Compliance with prescribed medications will improve  Medication Management: RN will administer medications as ordered by provider, will assess and evaluate patient's response and provide education to patient for prescribed medication. RN will report any adverse and/or side effects to prescribing provider.  Therapeutic Interventions: 1 on 1 counseling sessions, Psychoeducation, Medication administration, Evaluate responses to treatment, Monitor vital signs and CBGs as ordered, Perform/monitor CIWA, COWS, AIMS and Fall Risk screenings as ordered, Perform wound care treatments as ordered.  Evaluation of Outcomes: Progressing   LCSW Treatment Plan for Primary Diagnosis: Major depressive disorder, recurrent severe without psychotic features (HCC) Long Term Goal(s): Safe transition to appropriate next level of care at discharge, Engage patient in therapeutic group addressing interpersonal concerns.  Short Term Goals: Engage patient in aftercare planning with referrals and resources and Increase social support  Therapeutic Interventions: Assess for all discharge needs, 1 to 1 time with Social worker, Explore available resources and support systems, Assess for adequacy in community support network, Educate  family and significant other(s) on suicide prevention, Complete Psychosocial Assessment, Interpersonal group therapy.  Evaluation of Outcomes: Progressing   Progress in Treatment: Attending groups: Yes. Participating in groups: Yes. Taking medication as prescribed: Yes. Toleration medication: Yes. Family/Significant other contact made: Yes, individual(s) contacted:  father Patient understands diagnosis: Yes. Discussing patient identified problems/goals with staff: Yes. Medical problems stabilized or resolved: Yes. Denies suicidal/homicidal ideation: Yes. Issues/concerns per patient self-inventory: Yes. Other: none  New problem(s) identified: No, Describe:  none  New Short Term/Long Term Goal(s):Pt goal: "I need mental and emotional stability and to not feel suicidal."  Discharge Plan or Barriers: CSW assessing for appropriate plan  Reason for Continuation of Hospitalization: Depression Medication stabilization  Estimated Length of Stay:2-3 days  Attendees: Patient: Sarah Good 06/24/2016   Physician: Dr. Ardyth HarpsHernandez, MD 06/24/2016   Nursing: Leonia ReaderPhyllis Cobb, RN 06/24/2016   RN Care Manager: Maurice Marchorrie Terry, RN 06/24/2016   Social Worker: Daleen SquibbGreg Azalie Harbeck, LCSW 06/24/2016   Recreational Therapist:  06/24/2016   Other:  06/24/2016   Other:  06/24/2016   Other: 06/24/2016     Scribe for Treatment Team: Lorri FrederickWierda, Arleny Kruger Jon, LCSW 06/24/2016 3:39 PM

## 2016-06-24 NOTE — Progress Notes (Signed)
Patient pleasant and cooperative with care. Reports slept well on last pm. With medication. Appetite fair, energy level normal. Pt verbally denies SI, HI, AVH. Endorses depression at 8, hopelessness at 10 and anxiety at 229. Pt noted on unit talking and laughing with peers.  Encouragement and support offered. Safety checks maintained. Medications given as prescribed. Pt receptive, verbalized understanding of medications and remains safe on unit with q 15 min checks.

## 2016-06-24 NOTE — [facility] Group Notes (Signed)
[facility] Group Notes:  (Nursing/MHT/Case Management/Adjunct)  Date:  06/24/2016  Time:  8:57 PM  Type of Therapy:  Group Therapy  Participation Level:  Active  Participation Quality:  Appropriate  Affect:  Appropriate  Cognitive:  Appropriate  Insight:  Appropriate  Engagement in Group:  Engaged  Modes of Intervention:  Discussion  Summary of Progress/Problems:  Sarah EkJanice Marie Fenton Good 06/24/2016, 8:57 PM
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
## 2016-06-25 MED ORDER — QUETIAPINE FUMARATE 25 MG PO TABS
25.0000 mg | ORAL_TABLET | Freq: Three times a day (TID) | ORAL | Status: DC
  Administered 2016-06-25: 25 mg via ORAL
  Filled 2016-06-25: qty 1

## 2016-06-25 MED ORDER — HYDROXYZINE HCL 25 MG PO TABS
25.0000 mg | ORAL_TABLET | Freq: Three times a day (TID) | ORAL | Status: DC | PRN
  Administered 2016-06-25: 25 mg via ORAL
  Filled 2016-06-25: qty 1

## 2016-06-25 MED ORDER — QUETIAPINE FUMARATE 25 MG PO TABS
25.0000 mg | ORAL_TABLET | Freq: Once | ORAL | Status: AC
  Administered 2016-06-25: 25 mg via ORAL
  Filled 2016-06-25: qty 1

## 2016-06-25 NOTE — [facility] Group Notes (Signed)
[facility] Group Notes:  (Nursing/MHT/Case Management/Adjunct)  Date:  06/25/2016  Time:  2:48 PM  Type of Therapy:  Psychoeducational Skills  Participation Level:  Active  Participation Quality:  Appropriate, Attentive and Supportive  Affect:  Appropriate  Cognitive:  Appropriate  Insight:  Appropriate  Engagement in Group:  Engaged and Supportive  Modes of Intervention:  Discussion and Education  Summary of Progress/Problems:  Sarah Good 06/25/2016, 2:48 PM

## 2016-06-25 NOTE — Progress Notes (Signed)
Recreation Therapy Notes  Date: 05.15.18 Time: 3:00 pm Location: Craft Room  Group Topic: Self-expression  Goal Area(s) Addresses:  Patient will be able to identify a color that represents each emotion. Patient will verbalize benefit of using art as a means of self-expression. Patient will verbalize one emotion experienced while participating in activity.  Behavioral Response: Attentive, Interactive  Intervention: The Colors Within Me  Activity: Patients were given blank face worksheets and were instructed pick a color for each emotion they were feeling and show on the worksheet how much of that emotion they were feeling.  Education: LRT educated patients on other forms of self-expression.  Education Outcome: Acknowledges education/In group clarification offered   Clinical Observations/Feedback: Patient picked a color for each emotion she was feeling and showed on the worksheet how much of that emotion she was feeling. Patient contributed to group discussion by stating what emotions she was feeling, how her emotions affect her treatment in the hospital, that her emotions are dynamic, what can cause her emotions to change, how she sees her emotions changing when she is able to d/c, that it was helpful to see her emotions on paper and why, and what she can do to maintain feeling positive.  Jacquelynn CreeGreene,Devarious Pavek M, LRT/CTRS 06/25/2016 3:52 PM

## 2016-06-25 NOTE — Plan of Care (Signed)
Problem: Education: Goal: Mental status will improve Outcome: Not Progressing Patient having panic attacks and racing thoughts

## 2016-06-25 NOTE — Progress Notes (Signed)
Patient pleasant and cooperative. Approached nurse after lunch reporting shes having a panic attack and racing thoughts. Pt tearful during attack. Prn given with good relief as well as educating patient on relief techniques. Denies SI, HI, AVH.  Encouragement and support offered. Safety checks maintained. Medications given as prescribed. Pt receptive and remains safe on unit with q 15 min checks.

## 2016-06-25 NOTE — Plan of Care (Signed)
Problem: Activity: Goal: Sleeping patterns will improve Outcome: Progressing Patient slept for Estimated Hours of 8; every 15 minutes safety round maintained, no injury or falls during this shift.    

## 2016-06-25 NOTE — [facility] Group Notes (Signed)
[facility] LCSW Group Therapy Note  Date/Time:06/25/16, 0930  Type of Therapy/Topic:  Group Therapy:  Feelings about Diagnosis  Participation Level:  Active   Mood: pleasant   Description of Group:    This group will allow patients to explore their thoughts and feelings about diagnoses they have received. Patients will be guided to explore their level of understanding and acceptance of these diagnoses. Facilitator will encourage patients to process their thoughts and feelings about the reactions of others to their diagnosis, and will guide patients in identifying ways to discuss their diagnosis with significant others in their lives. This group will be process-oriented, with patients participating in exploration of their own experiences as well as giving and receiving support and challenge from other group members.   Therapeutic Goals: 1. Patient will demonstrate understanding of diagnosis as evidence by identifying two or more symptoms of the disorder:  2. Patient will be able to express two feelings regarding the diagnosis 3. Patient will demonstrate ability to communicate their needs through discussion and/or role plays  Summary of Patient Progress: PT identified several diagnoses including PTSD and borderline personality disorder.  PT was able to name multiple symptoms of each disorder.  PT was active in the group discussion regarding stigma and feelings about mental health diagnoses.       Therapeutic Modalities:   Cognitive Behavioral Therapy Brief Therapy Feelings Identification   Daleen SquibbGreg Reva Pinkley, LCSW
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
## 2016-06-26 MED ORDER — QUETIAPINE FUMARATE 25 MG PO TABS
25.0000 mg | ORAL_TABLET | Freq: Three times a day (TID) | ORAL | Status: DC
  Administered 2016-06-26 – 2016-07-01 (×21): 25 mg via ORAL
  Filled 2016-06-26 (×21): qty 1

## 2016-06-26 NOTE — Plan of Care (Signed)
Problem: [facility] Participation in Recreation Therapeutic Interventions Goal: STG-Patient will demonstrate improved self esteem by identif STG: Self-Esteem - Within 4 treatment sessions, patient will verbalize at least 5 positive affirmation statements in each of 2 treatment sessions to increase self-esteem.  Outcome: Progressing Treatment Session 1; Completed 1 out of 2: At approximately 9:00 am, LRT met with patient in consultation room. Patient verbalized 5 positive affirmation statements. Patient reported, "Some are easier to say than others." LRT encouraged patient to continue saying positive affirmation statements.  Leonette Monarch, LRT/CTRS 05.16.18 2:05 pm Goal: STG-Other Recreation Therapy Goal (Specify) STG: Stress Management - Within 4 treatment sessions, patient will verbalize understanding of the stress management techniques in each of 2 treatment sessions to increase stress management skills.  Outcome: Progressing Treatment Session 1; Completed 1 out of 2: At approximately 9:00 am, LRT met with patient in consultation room. LRT educated and provided patient with handouts on stress management techniques. Patient verbalized understanding of the stress management techniques. LRT encouraged patient to read over and practice the stress management techniques.  Leonette Monarch, LRT/CTRS 05.16.18 2:07 pm

## 2016-06-26 NOTE — Progress Notes (Signed)
D: Patient stated slept good last night .Stated appetite is good and energy level  Is normal. Stated concentration is good . Stated on Depression scale , hopeless and anxiety .( low 0-10 high) Denies suicidal  homicidal ideations  .  No auditory hallucinations  No pain concerns . Appropriate ADL'S. Interacting with peers and staff.  A: Encourage patient participation with unit programming . Instruction  Given on  Medication , verbalize understanding. R: Voice no other concerns. Staff continue to monitor  

## 2016-06-26 NOTE — Progress Notes (Signed)
Patient ID: Sarah Good, female   DOB: 1990/12/08, 26 y.o.   MRN: [id number] Bright, jovial, interacting well with peers, observed in the Courtyard playing basket basketball with others; have a great grip/insight about her anxiety and knowledge of medications, hopeful and optimistic, expresses faith in God, denied SI/HI/AVH.

## 2016-06-26 NOTE — [facility] Group Notes (Signed)
[facility] Group Notes:  (Nursing/MHT/Case Management/Adjunct)  Date:  06/26/2016  Time:  4:11 PM  Type of Therapy:  Psychoeducational Skills  Participation Level:  Active  Participation Quality:  Appropriate and Sharing  Affect:  Appropriate  Cognitive:  Alert and Oriented  Insight:  Appropriate and Good  Engagement in Group:  Engaged  Modes of Intervention:  Discussion and Education  Summary of Progress/Problems:  Mickey Farberamela M Moses Ellison 06/26/2016, 4:11 PM

## 2016-06-26 NOTE — Progress Notes (Signed)
Recreation Therapy Notes  Date: 05.16.18 Time: 9:30 am Location: Craft Room  Group Topic: Self-esteem  Goal Area(s) Addresses:  Patient will write at least one positive trait about self. Patient will verbalize benefit of having a healthy self-esteem.  Behavioral Response: Attentive, Interactive  Intervention: I Am  Activity: Patients were given a worksheet with the letter I on it and were instructed to write as many positive traits about themselves inside the letter.  Education: LRT educated patients on ways they can increase their self-esteem.  Education Outcome: Acknowledges education/In group clarification offered   Clinical Observations/Feedback: Patient wrote positive traits about self. Patient contributed to group discussion by stating it was difficult to think of positive traits and why, and how she can increase her self-esteem.   Jacquelynn CreeGreene,Jenica Costilow M, LRT/CTRS 06/26/2016 1:51 PM

## 2016-06-26 NOTE — Plan of Care (Signed)
Problem: Coping: Goal: Ability to verbalize frustrations and anger appropriately will improve Outcome: Progressing Working fon Pharmacologistcoping skills
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
## 2016-06-26 NOTE — Progress Notes (Signed)
CSW spoke with pt's father, Julieanne MansonScott [name], who shared that he has been trying to get pt to begin supporting herself for quite a while and has so far been unsuccessful.  Pt's mother is frustrated and feels that pt needs tough love at this point.  Scott agrees and has been continuing to support her financially because he worries what will happen otherwise.  Pt has [facility] after her last hospitalization.  We discussed options for him to give pt deadline to have a job so that she could potentially afford a small place in town and on the bus line, which would solve the transportation problem.  CSW will work on setting up follow up for pt as an outpatient and Lorin PicketScott will communicate expectations for short term continuation of his financial support, such as getting a job ASAP. Garner NashGregory Glenyce Randle, MSW, LCSW Clinical Social Worker 06/26/2016 10:18 AM

## 2016-06-26 NOTE — Plan of Care (Signed)
Problem: Activity: Goal: Sleeping patterns will improve Outcome: Progressing Patient slept for Estimated Hours of 76.45; every 15 minutes safety round maintained, no injury or falls during this shift.

## 2016-06-27 NOTE — Plan of Care (Signed)
Problem: Coping: Goal: Ability to identify and develop effective coping behavior will improve Outcome: Progressing Working on coping skills .    

## 2016-06-27 NOTE — Progress Notes (Signed)
Recreation Therapy Notes  Date: 05.17.18 Time: 1:00 pm Location: Craft Room  Group Topic: Leisure Education  Goal Area(s) Addresses:  Patient will identify activities for each letter of the alphabet. Patient will verbalize ability to integrate positive leisure into life post d/c. Patient will verbalize ability to use leisure as a Associate Professorcoping skill.  Behavioral Response: Attentive, Interactive  Intervention: Leisure Alphabet  Activity: Patients were given a Leisure Information systems managerAlphabet worksheet and were instructed to write healthy leisure activities for each letter of the alphabet.   Education: LRT educated patient on what they need to participate in leisure.  Education Outcome: Acknowledges education/In group clarification offered  Clinical Observations/Feedback: Patient wrote healthy leisure activities. Patient contributed to group discussion by stating some of her healthy leisure activities, what she needs to participate in leisure, and how she can put leisure back into her schedule.  Jacquelynn CreeGreene,Samaad Hashem M, LRT/CTRS 06/27/2016 2:10 PM

## 2016-06-27 NOTE — Progress Notes (Signed)
Patient ID: Sarah Good, female   DOB: 07-May-1990, 26 y.o.   MRN: [id number] A&Ox3, denied pain, continues to have bright mood and affect, upbeat, assertive, logical, coherent, organized in thoughts/contents with appropriate behaviors; denied SI/SIB/HI/AVH; complied with bedtime medications

## 2016-06-27 NOTE — [facility] Group Notes (Signed)
[facility] Group Notes:  (Nursing/MHT/Case Management/Adjunct)  Date:  06/27/2016  Time:  4:05 PM  Type of Therapy:  Psychoeducational Skills  Participation Level:  Active  Participation Quality:  Appropriate, Attentive, Sharing and Supportive  Affect:  Appropriate  Cognitive:  Alert and Appropriate  Insight:  Appropriate  Engagement in Group:  Engaged and Supportive  Modes of Intervention:  Discussion, Education, Exploration and Support  Summary of Progress/Problems:  Sarah BullaYASMINE W Shepherd Good 06/27/2016, 4:05 PM

## 2016-06-27 NOTE — Plan of Care (Signed)
Problem: Activity: Goal: Sleeping patterns will improve Outcome: Progressing Patient slept for Estimated Hours of 6.45; every 15 minutes safety round maintained, no injury or falls during this shift.    

## 2016-06-27 NOTE — Progress Notes (Signed)
D: Patient request to see the chaplin  . Patient able to see  Talk about her goals  1. How to get home tomorrow .2.  Relax Breathing Thecnique  3. Coping skills . Patient able to voice of her diagnosis  Of Depression and Anxiety /Depression D: Patient stated slept good last night .Stated appetite poor and energy level  Is normal. Stated concentration poor. Stated on Depression scale 8  , hopeless 8 and anxiety 7  .( low 0-10 high) Denies suicidal  homicidal ideations  . But stated she is a old cutter and there are certain triggers that  Makes her think in that way but she stated she would not act on it.   No auditory hallucinations  No pain concerns . Appropriate ADL'S. Interacting with peers and staff.  A:  Encourage patient participation with unit programming . Instruction  Given on  Medication , verbalize understanding. R:  Voice no other concerns. Staff continue to monitor

## 2016-06-27 NOTE — [facility] Group Notes (Signed)
[facility] Group Notes:  (Nursing/MHT/Case Management/Adjunct)  Date:  06/27/2016  Time:  5:31 AM  Type of Therapy:  Group Therapy  Participation Level:  Active  Participation Quality:  Appropriate  Affect:  Appropriate  Cognitive:  Appropriate  Insight:  Appropriate  Engagement in Group:  Engaged  Modes of Intervention:  n/a  Summary of Progress/Problems:  Veva Holesshley Imani Ava Deguire 06/27/2016, 5:31 AM
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
## 2016-06-27 NOTE — [facility] Group Notes (Signed)
[facility] LCSW Group Therapy Note  Date/Time: 06/27/16, 0930  Type of Therapy/Topic:  Group Therapy:  Balance in Life  Participation Level:  active  Description of Group:    This group will address the concept of balance and how it feels and looks when one is unbalanced. Patients will be encouraged to process areas in their lives that are out of balance, and identify reasons for remaining unbalanced. Facilitators will guide patients utilizing problem- solving interventions to address and correct the stressor making their life unbalanced. Understanding and applying boundaries will be explored and addressed for obtaining  and maintaining a balanced life. Patients will be encouraged to explore ways to assertively make their unbalanced needs known to significant others in their lives, using other group members and facilitator for support and feedback.  Therapeutic Goals: 1. Patient will identify two or more emotions or situations they have that consume much of in their lives. 2. Patient will identify signs/triggers that life has become out of balance:  3. Patient will identify two ways to set boundaries in order to achieve balance in their lives:  4. Patient will demonstrate ability to communicate their needs through discussion and/or role plays  Summary of Patient Progress: Pt identified mental/emotional and financial as areas of her life that are out of balance.  Pt participated in group discussion regarding actions she can take, like employment, that can get her life in better balance.          Therapeutic Modalities:   Cognitive Behavioral Therapy Solution-Focused Therapy Assertiveness Training  Sarah SquibbGreg Saagar Tortorella, [place]

## 2016-06-27 NOTE — Progress Notes (Signed)
CH responded to a PG for room 303BH. Pt was stable and presented calm and rational. Pt told of her religious background and her desire to find a more contemporary worship setting in the [place] CH suggested that most churches would help her find a way to participate. Pt spoke of a broken relationship with her mother. Pt desires to please, and also has her own life. CH and Pt explored ways in which she could establish her own path while seeking ways to mend the brokenness she feels. As our meeting area was in a highly public space, our conversation was ended with hopes of exploring other avenues at a later date and in a suitable conference area. CH is available for follow up as needed.    06/27/16 1500  Clinical Encounter Type  Visited With Patient;Health care provider  Visit Type Initial;Spiritual support;Behavioral Health  Referral From Nurse  Consult/Referral To Chaplain  Spiritual Encounters  Spiritual Needs [facility];Emotional  Stress Factors  Patient Stress Factors Family relationships;Financial concerns

## 2016-06-27 NOTE — B[facility] Group Notes (Signed)
Goals Group Date/Time: 06/27/2016 9:00 AM Type of Therapy and Topic: Group Therapy: Goals Group: SMART Goals   Participation Level: Moderate  Description of Group:    The purpose of a daily goals group is to assist and guide patients in setting recovery/wellness-related goals. The objective is to set goals as they relate to the crisis in which they were admitted. Patients will be using SMART goal modalities to set measurable goals. Characteristics of realistic goals will be discussed and patients will be assisted in setting and processing how one will reach their goal. Facilitator will also assist patients in applying interventions and coping skills learned in psycho-education groups to the SMART goal and process how one will achieve defined goal.   Therapeutic Goals:   -Patients will develop and document one goal related to or their crisis in which brought them into treatment.  -Patients will be guided by LCSW using SMART goal setting modality in how to set a measurable, attainable, realistic and time sensitive goal.  -Patients will process barriers in reaching goal.  -Patients will process interventions in how to overcome and successful in reaching goal.   Patient's Goal: Pt goal is to work on arranging transportation home and to stay awake all day to help get her sleep routine back on track.   Therapeutic Modalities:  Motivational Interviewing  Research officer, political partyCognitive Behavioral Therapy  Crisis Intervention Model  SMART goals setting   Daleen SquibbGreg Gayathri Futrell, [place]

## 2016-06-28 MED ORDER — QUETIAPINE FUMARATE 50 MG PO TABS: 50.0000 mg | ORAL_TABLET | Freq: Two times a day (BID) | ORAL | 0 refills | Status: AC

## 2016-06-28 MED ORDER — TRAZODONE HCL 150 MG PO TABS: 150.0000 mg | ORAL_TABLET | Freq: Every day | ORAL | 0 refills | Status: AC

## 2016-06-28 MED ORDER — SUMATRIPTAN SUCCINATE 50 MG PO TABS
100.0000 mg | ORAL_TABLET | ORAL | Status: DC | PRN
  Administered 2016-06-28: 100 mg via ORAL
  Filled 2016-06-28: qty 2

## 2016-06-28 MED ORDER — VENLAFAXINE HCL ER 75 MG PO CP24: 75.0000 mg | ORAL_CAPSULE | Freq: Every day | ORAL | 0 refills | Status: AC

## 2016-06-28 NOTE — Plan of Care (Signed)
Problem: [facility] Participation in Recreation Therapeutic Interventions Goal: STG-Patient will demonstrate improved self esteem by identif STG: Self-Esteem - Within 4 treatment sessions, patient will verbalize at least 5 positive affirmation statements in each of 2 treatment sessions to increase self-esteem.  Outcome: Completed/Met Date Met: 06/28/16 Treatment Session 2; Completed 2 out of 2: At approximately 9:00 am, LRT met with patient in consultation room. Patient verbalized 5 positive affirmation statements. Patient reported, "Some of them are a lot easier than others." LRT encouraged patient to continue saying positive affirmation statements.  Leonette Monarch, LRT/CTRS 05.18.18 3:44 pm Goal: STG-Other Recreation Therapy Goal (Specify) STG: Stress Management - Within 4 treatment sessions, patient will verbalize understanding of the stress management techniques in each of 2 treatment sessions to increase stress management skills.  Outcome: Completed/Met Date Met: 06/28/16 Treatment Session 2; Completed 2 out of 2: At approximately 9:00 am, LRT met with patient in consultation room. Patient reported she read over and practiced the stress management techniques. Patient verbalized understanding and reported the techniques were helpful. LRT encouraged patient to continue practicing the stress management techniques.  Leonette Monarch, LRT/CTRS 05.18.18 3:45 pm

## 2016-06-28 NOTE — B[facility] Group Notes (Signed)
[facility] LCSW Group Therapy Note  Date/Time: 06/28/16, 0930  Type of Therapy and Topic:  Group Therapy:  Feelings around Relapse and Recovery  Participation Level:  Active   Mood:pleasant  Description of Group:    Patients in this group will discuss emotions they experience before and after a relapse. They will process how experiencing these feelings, or avoidance of experiencing them, relates to having a relapse. Facilitator will guide patients to explore emotions they have related to recovery. Patients will be encouraged to process which emotions are more powerful. They will be guided to discuss the emotional reaction significant others in their lives may have to patients' relapse or recovery. Patients will be assisted in exploring ways to respond to the emotions of others without this contributing to a relapse.  Therapeutic Goals: 1. Patient will identify two or more emotions that lead to relapse for them:  2. Patient will identify two emotions that result when they relapse:  3. Patient will identify two emotions related to recovery:  4. Patient will demonstrate ability to communicate their needs through discussion and/or role plays.   Summary of Patient Progress: Pt identified anger and lonliness as emotions that can lead to relapse.  Pt engaged in group discussion about managing feelings and emotions in a more positive way.  Always good participation in group.     Therapeutic Modalities:   Cognitive Behavioral Therapy Solution-Focused Therapy Assertiveness Training Relapse Prevention Therapy  Daleen SquibbGreg Lonell Stamos, LCSW

## 2016-06-28 NOTE — Plan of Care (Signed)
Problem: Coping: Goal: Ability to identify and develop effective coping behavior will improve Outcome: Progressing Working on coping skills .    

## 2016-06-28 NOTE — Plan of Care (Signed)
Problem: Coping: Goal: Ability to cope will improve Outcome: Progressing Working on  Coping skills     

## 2016-06-28 NOTE — Tx Team (Signed)
Interdisciplinary Treatment and Diagnostic Plan Update  06/28/2016 Time of Session: 1135 Sarah Good MRN: [id number]  Principal Diagnosis: Major depressive disorder, recurrent severe without psychotic features (HCC)  Secondary Diagnoses: Principal Problem:   Major depressive disorder, recurrent severe without psychotic features (HCC) Active Problems:   PTSD (post-traumatic stress disorder)   Borderline personality disorder   Vitamin D deficiency   Seasonal allergies   Current Medications:  Current Facility-Administered Medications  Medication Dose Route Frequency Provider Last Rate Last Dose  . acetaminophen (TYLENOL) tablet 650 mg  650 mg Oral Q6H PRN Jimmy Footman, MD   650 mg at 06/22/16 1620  . alum & mag hydroxide-simeth (MAALOX/MYLANTA) 200-200-20 MG/5ML suspension 30 mL  30 mL Oral Q4H PRN Jimmy Footman, MD      . calcium citrate-vitamin D 500-500 MG-UNIT per chewable tablet 2 tablet  2 tablet Oral Daily Jimmy Footman, MD   2 tablet at 06/28/16 0805  . fluticasone (FLONASE) 50 MCG/ACT nasal spray 2 spray  2 spray Each Nare Daily Jimmy Footman, MD   2 spray at 06/28/16 0806  . loratadine (CLARITIN) tablet 10 mg  10 mg Oral Daily Jimmy Footman, MD   10 mg at 06/28/16 0806  . magnesium hydroxide (MILK OF MAGNESIA) suspension 30 mL  30 mL Oral Daily PRN Jimmy Footman, MD      . meclizine (ANTIVERT) tablet 12.5 mg  12.5 mg Oral TID PRN Jimmy Footman, MD   12.5 mg at 06/24/16 1400  . QUEtiapine (SEROQUEL) tablet 25 mg  25 mg Oral TID PC & HS Jimmy Footman, MD   25 mg at 06/28/16 0806  . traZODone (DESYREL) tablet 150 mg  150 mg Oral QHS Jimmy Footman, MD   150 mg at 06/27/16 2130  . venlafaxine XR (EFFEXOR-XR) 24 hr capsule 37.5 mg  37.5 mg Oral Q breakfast Jimmy Footman, MD   37.5 mg at 06/28/16 0806   PTA Medications: Prescriptions Prior to  Admission  Medication Sig Dispense Refill Last Dose  . fexofenadine (ALLEGRA) 180 MG tablet Take 180 mg by mouth daily.   06/19/2016 at unknown  . levonorgestrel (MIRENA) 20 MCG/24HR IUD 1 each by Intrauterine route once.   06/20/2016 at unknown    Patient Stressors: Financial difficulties Marital or family conflict Medication change or noncompliance Occupational concerns Traumatic event  Patient Strengths: Ability for insight Capable of independent living Communication skills Motivation for treatment/growth Physical Health Work skills  Treatment Modalities: Medication Management, Group therapy, Case management,  1 to 1 session with clinician, Psychoeducation, Recreational therapy.   Physician Treatment Plan for Primary Diagnosis: Major depressive disorder, recurrent severe without psychotic features (HCC) Long Term Goal(s): Improvement in symptoms so as ready for discharge Improvement in symptoms so as ready for discharge   Short Term Goals: Ability to verbalize feelings will improve Ability to disclose and discuss suicidal ideas Ability to identify and develop effective coping behaviors will improve Ability to identify changes in lifestyle to reduce recurrence of condition will improve Compliance with prescribed medications will improve  Medication Management: Evaluate patient's response, side effects, and tolerance of medication regimen.  Therapeutic Interventions: 1 to 1 sessions, Unit Group sessions and Medication administration.  Evaluation of Outcomes: Progressing  Physician Treatment Plan for Secondary Diagnosis: Principal Problem:   Major depressive disorder, recurrent severe without psychotic features (HCC) Active Problems:   PTSD (post-traumatic stress disorder)   Borderline personality disorder   Vitamin D deficiency   Seasonal allergies  Long Term Goal(s): Improvement in symptoms so  as ready for discharge Improvement in symptoms so as ready for discharge    Short Term Goals: Ability to verbalize feelings will improve Ability to disclose and discuss suicidal ideas Ability to identify and develop effective coping behaviors will improve Ability to identify changes in lifestyle to reduce recurrence of condition will improve Compliance with prescribed medications will improve     Medication Management: Evaluate patient's response, side effects, and tolerance of medication regimen.  Therapeutic Interventions: 1 to 1 sessions, Unit Group sessions and Medication administration.  Evaluation of Outcomes: Progressing   RN Treatment Plan for Primary Diagnosis: Major depressive disorder, recurrent severe without psychotic features (HCC) Long Term Goal(s): Knowledge of disease and therapeutic regimen to maintain health will improve  Short Term Goals: Ability to verbalize feelings will improve, Ability to disclose and discuss suicidal ideas, Ability to identify and develop effective coping behaviors will improve and Compliance with prescribed medications will improve  Medication Management: RN will administer medications as ordered by provider, will assess and evaluate patient's response and provide education to patient for prescribed medication. RN will report any adverse and/or side effects to prescribing provider.  Therapeutic Interventions: 1 on 1 counseling sessions, Psychoeducation, Medication administration, Evaluate responses to treatment, Monitor vital signs and CBGs as ordered, Perform/monitor CIWA, COWS, AIMS and Fall Risk screenings as ordered, Perform wound care treatments as ordered.  Evaluation of Outcomes: Progressing   LCSW Treatment Plan for Primary Diagnosis: Major depressive disorder, recurrent severe without psychotic features (HCC) Long Term Goal(s): Safe transition to appropriate next level of care at discharge, Engage patient in therapeutic group addressing interpersonal concerns.  Short Term Goals: Engage patient in aftercare  planning with referrals and resources and Increase social support  Therapeutic Interventions: Assess for all discharge needs, 1 to 1 time with Social worker, Explore available resources and support systems, Assess for adequacy in community support network, Educate family and significant other(s) on suicide prevention, Complete Psychosocial Assessment, Interpersonal group therapy.  Evaluation of Outcomes: Progressing   Progress in Treatment: Attending groups: Yes. Participating in groups: Yes. Taking medication as prescribed: Yes. Toleration medication: Yes. Family/Significant other contact made: Yes, individual(s) contacted:  father Patient understands diagnosis: Yes. Discussing patient identified problems/goals with staff: Yes. Medical problems stabilized or resolved: Yes. Denies suicidal/homicidal ideation: Yes. Issues/concerns per patient self-inventory: Yes. Other: none  New problem(s) identified: No, Describe:  none  New Short Term/Long Term Goal(s):Pt goal: "I need mental and emotional stability and to not feel suicidal."  Discharge Plan or Barriers: CSW assessing for appropriate plan  Reason for Continuation of Hospitalization: Depression Medication stabilization  Estimated Length of Stay:2-3 days  Attendees: Patient:  06/28/2016   Physician: Dr. Ardyth HarpsHernandez, MD 06/28/2016   Nursing: Hulan AmatoGwen Farrish, RN 06/28/2016   RN Care Manager:  06/28/2016   Social Worker: Daleen SquibbGreg Laquasia Pincus, LCSW 06/28/2016   Recreational Therapist: Hershal CoriaBeth Greene, LRT/CTRS  06/28/2016   Other:  06/28/2016   Other:  06/28/2016   Other: 06/28/2016     Scribe for Treatment Team: Lorri FrederickWierda, Balian Schaller Jon, LCSW 06/28/2016 11:41 AM

## 2016-06-28 NOTE — Progress Notes (Signed)
Pt. Alert and oriented, warm and dry, in no distress. Pt. Denies SI, HI, and AVH. Pt reports that she feels her medications are working well. Reports she has ben awake all day and that she attended all groups. Pt. Encouraged to let nursing staff know of any concerns or needs.

## 2016-06-28 NOTE — Progress Notes (Signed)
[facility] MD Progress Note  06/28/2016 8:42 AM Sarah Good  MRN:  [id number] Subjective:  26 year old single Caucasian female who carries a diagnosis of major depressive disorder, PTSD and borderline personality disorder who presented to [facility], emergency department voluntarily on May 10 voicing suicidal ideation.  Patient states since October 2017 she has not received any psychiatric care or medications. Says that she has private insurance through her father's job but doesn't have money for the co-pays.  She has a long history of depression, self injury and chronic suicidal ideation. She has been hospitalized already 6 times since the age of 14.  Prior to admission the patient attempted to drown herself [place] a [place] near her apartment. She says that within 72 hours prior to this suicidal attempt she she had several stressors. She was turned down from a job that she applied at [employer], she got into an argument with her roommate and with her mother. She also learned that another roommate is moving out of state.   5/13 patient states she slept okay last night. She woke up a couple times. She continues to have suicidal ideation. Her mood and suicidal ideation have not improved any since admission. She denies any side effects or physical complaints today. Denies homicidality or auditory or visual hallucinations  5/14 patient hasn't had any improvement since admission. She continues to feel very distressed, has depressed mood, anxiety and continues to have suicidal ideation. Patient says she is afraid of loosing the place where she leaves him as her father is no longer willing to pay for it. She is also afraid of not being able to afford co-pays or obtain transportation to make it to her appointments  5/15 not better. Still suicidal. Had a panic attack this morning. Says she didn't sleep well last night. She feels very depressed and concerned about her problems. Says that she has been extremely anxious  especially at night. Denies side effects from medications or any physical complaints  5/16 patient is no longer suicidal but has thoughts of self-harm. She feels her anxiety has decreased since we added the Seroquel yesterday evening. Mood still depressed. She slept well last night. Denies any side effects from Seroquel. Denies any physical complaints. Denies having any hallucinations. Patient has been attending groups  5/17 patient says that today she feels a little bit better. Thoughts of suicide and self-harm are not as intense as they were before. She feels that the Seroquel has been helpful with her anxiety and also with her sleep. Says that yesterday she was a little overly sedated but today she feels better. She denies any other side effects or physical complaints. She has been active in groups. She has display appropriate interactions with peers and with staff.  5/18 patient reports doing better. She is having a bad migraine today and she was lying in bed and not participating in groups this afternoon. Other than that the patient feels she is improving. She is not suicidal. Only has fleeting thoughts of self-harm but no intention on acting on the thoughts. She feels more hopeful. She likes the medication she is taking. She feels that the medications are effective. Denies any other physical complaints and denies any side effects from medications. She denies SI, HI or hallucinations today  Per nursing: Pt. Alert and oriented, warm and dry, in no distress. Pt. Denies SI, HI, and AVH. Pt reports that she feels her medications are working well. Reports she has ben awake all day and that she attended  all groups. Pt. Encouraged to let nursing staff know of any concerns or needs.   Principal Problem: Major depressive disorder, recurrent severe without psychotic features (HCC) Diagnosis:   Patient Active Problem List   Diagnosis Date Noted  . PTSD (post-traumatic stress disorder) [F43.10] 06/22/2016   . Borderline personality disorder [F60.3] 06/22/2016  . Vitamin D deficiency [E55.9] 06/22/2016  . Seasonal allergies [J30.2] 06/22/2016  . Major depressive disorder, recurrent severe without psychotic features (HCC) [F33.2] 11/09/2015   Total Time spent with patient: 30 minutes  Past Psychiatric History: Multiple prior psychiatric hospitalizations that started after the age of 26. Has been hospitalized at least 6 times. Has history of self injury cuts herself on her abdomen and tights. Has been diagnosed with borderline personality disorder, major depressive disorder, PTSD, GAD or panic disorder.  Has been admitted to behavioral health [place] partial hospitalization program in the past.  Past Medical History:  Past Medical History:  Diagnosis Date  . Anxiety   . Depression   . Migraine   . Mitral valve prolapse   . Personality disorder   . PTSD (post-traumatic stress disorder)     Past Surgical History:  Procedure Laterality Date  . WISDOM TOOTH EXTRACTION     Family History:  Family History  Problem Relation Age of Onset  . Adopted: Yes   Family Psychiatric  History: Patient is adopted and is unaware of any psychiatric history in her family  Social History: Patient is single, never married, currently unemployed, and not attending college. She lives in [place] with 3 roommates. Her father supports her financially. Patient is afraid that her father is going to stop paying for her bills. Patient's mother lives in [place] and date have a difficult relationship. Patient's father lives in [place]. Patient started going to college in [place] but she started having psychiatric problems around that time and stopped soon after her first year. History  Alcohol Use  . 1.8 oz/week  . 2 Glasses of wine, 1 Shots of liquor per week    Comment: socially     History  Drug Use No    Comment: Second-hand    Social History   Social History  . Marital status: Single    Spouse  name: N/A  . Number of children: N/A  . Years of education: N/A   Social History Main Topics  . Smoking status: Never Smoker  . Smokeless tobacco: Never Used  . Alcohol use 1.8 oz/week    2 Glasses of wine, 1 Shots of liquor per week     Comment: socially  . Drug use: No     Comment: Second-hand  . Sexual activity: Yes    Partners: Female    Birth control/ protection: IUD   Other Topics Concern  . None   Social History Narrative  . None     Current Medications: Current Facility-Administered Medications  Medication Dose Route Frequency Provider Last Rate Last Dose  . acetaminophen (TYLENOL) tablet 650 mg  650 mg Oral Q6H PRN Jimmy FootmanHernandez-Gonzalez, Daneille Desilva, MD   650 mg at 06/22/16 1620  . alum & mag hydroxide-simeth (MAALOX/MYLANTA) 200-200-20 MG/5ML suspension 30 mL  30 mL Oral Q4H PRN Jimmy FootmanHernandez-Gonzalez, Laurinda Carreno, MD      . calcium citrate-vitamin D 500-500 MG-UNIT per chewable tablet 2 tablet  2 tablet Oral Daily Jimmy FootmanHernandez-Gonzalez, Seynabou Fults, MD   2 tablet at 06/28/16 0805  . fluticasone (FLONASE) 50 MCG/ACT nasal spray 2 spray  2 spray Each Nare Daily Jimmy FootmanHernandez-Gonzalez, Deija Buhrman, MD  2 spray at 06/28/16 0806  . loratadine (CLARITIN) tablet 10 mg  10 mg Oral Daily Jimmy Footman, MD   10 mg at 06/28/16 0806  . magnesium hydroxide (MILK OF MAGNESIA) suspension 30 mL  30 mL Oral Daily PRN Jimmy Footman, MD      . meclizine (ANTIVERT) tablet 12.5 mg  12.5 mg Oral TID PRN Jimmy Footman, MD   12.5 mg at 06/24/16 1400  . QUEtiapine (SEROQUEL) tablet 25 mg  25 mg Oral TID PC & HS Jimmy Footman, MD   25 mg at 06/28/16 0806  . traZODone (DESYREL) tablet 150 mg  150 mg Oral QHS Jimmy Footman, MD   150 mg at 06/27/16 2130  . venlafaxine XR (EFFEXOR-XR) 24 hr capsule 37.5 mg  37.5 mg Oral Q breakfast Jimmy Footman, MD   37.5 mg at 06/28/16 1610    Lab Results:  No results found for this or any previous visit (from the  past 48 hour(s)).  Blood Alcohol level:  Lab Results  Component Value Date   ETH <5 06/20/2016    Metabolic Disorder Labs: No results found for: HGBA1C, MPG No results found for: PROLACTIN No results found for: CHOL, TRIG, HDL, CHOLHDL, VLDL, LDLCALC  Physical Findings: AIMS: Facial and Oral Movements Muscles of Facial Expression: None, normal Lips and Perioral Area: None, normal Jaw: None, normal Tongue: None, normal,Extremity Movements Upper (arms, wrists, hands, fingers): None, normal Lower (legs, knees, ankles, toes): None, normal, Trunk Movements Neck, shoulders, hips: None, normal, Overall Severity Severity of abnormal movements (highest score from questions above): None, normal Incapacitation due to abnormal movements: None, normal Patient's awareness of abnormal movements (rate only patient's report): No Awareness, Dental Status Current problems with teeth and/or dentures?: No Does patient usually wear dentures?: No  CIWA:    COWS:     Musculoskeletal: Strength & Muscle Tone: within normal limits Gait & Station: normal Patient leans: N/A  Psychiatric Specialty Exam: Physical Exam  Constitutional: She is oriented to person, place, and time. She appears well-developed and well-nourished.  HENT:  Head: Normocephalic and atraumatic.  Eyes: Conjunctivae and EOM are normal.  Neck: Normal range of motion.  Respiratory: Effort normal.  Musculoskeletal: Normal range of motion.  Neurological: She is alert and oriented to person, place, and time.    Review of Systems  Constitutional: Negative.   HENT: Negative.   Eyes: Negative.   Respiratory: Negative.   Cardiovascular: Negative.   Gastrointestinal: Negative.   Genitourinary: Negative.   Musculoskeletal: Negative.   Skin: Negative.   Neurological: Negative.   Endo/Heme/Allergies: Negative.   Psychiatric/Behavioral: Positive for depression. Negative for hallucinations, memory loss, substance abuse and suicidal  ideas. The patient is not nervous/anxious and does not have insomnia.     Blood pressure 96/64, pulse (!) 102, temperature 97.8 F (36.6 C), temperature source Oral, resp. rate 18, weight 79.4 kg (175 lb), SpO2 100 %.Body mass index is 30.04 kg/m.  General Appearance: Fairly Groomed  Eye Contact:  Good  Speech:  Clear and Coherent  Volume:  Normal  Mood:  Dysphoric  Affect:  Appropriate and Congruent  Thought Process:  Linear and Descriptions of Associations: Intact  Orientation:  Full (Time, Place, and Person)  Thought Content:  Hallucinations: None  Suicidal Thoughts:  No  Homicidal Thoughts:  No  Memory:  Immediate;   Good Recent;   Good Remote;   Good  Judgement:  Poor  Insight:  Fair  Psychomotor Activity:  Normal  Concentration:  Concentration: Good and  Attention Span: Good  Recall:  Good  Fund of Knowledge:  Good  Language:  Good  Akathisia:  No  Handed:    AIMS (if indicated):     Assets:  Communication Skills Social Support  ADL's:  Intact  Cognition:  WNL  Sleep:  Number of Hours: 5     Treatment Plan Summary: 26 year old Caucasian female with major depressive disorder, borderline personality disorder and PTSD. She was admitted after suicidal attempt by drowning. Patient has not been compliant with treatment since October 2017 due to finances.  Patient is currently not working and not attending school. Her father has been supporting her financially. Patient is in frequent conflict with her mother and father.  Major depressive disorder: Continued Effexor XR 37.5 mg by mouth daily.(will increase to 75 mg on Monday).  Continue Seroquel for antidepressant augmentation 25 mg 4 times a day  PTSD PTSD will be targeted with Effexor as well  Insomnia: continued trazodone 150 mg by mouth daily at bedtime  GAD: Will target symptoms of anxiety with Effexor and Seroquel  Migraines patient reports having a migraine today she requested Imitrex 100 mg.  Vitamin D  deficiency: continue vitamin D and calcium supplements  Seasonal allergies continue Claritin 10 mg a day and Flonase twice a day  Dizziness will order meclizine 3 times a day when necessary  Labs will order TSH--wnl  Precautions every 15 minute checks  Hospitalization status voluntary  Diet regular  Disposition was stable she will be discharged back to her home in [place]  Follow-up patient needs to be connected with outpatient psychiatric services. Patient is in need of being connected with dialectical behavioral therapy.----  Records from prior hospitalization were reviewed. Admitted at behavioral health in [place] in October 2017. Discharged on sertraline, trazodone and Neurontin.  Potential discharge early next week.   Jimmy Footman, MD 06/28/2016, 8:42 AM

## 2016-06-28 NOTE — Progress Notes (Signed)
D: Patient stated slept good last night .Stated appetite fairand energy level  Is normal. Stated concentration is poor. Stated on Depression scale , hopeless and anxiety .( low 0-10 high) Denies suicidal  homicidal ideations  .  No auditory hallucinations  No pain concerns . Appropriate ADL'S. Interacting with peers and staff. Voice of working on assertive communication and Using I feel statement  A: Encourage patient participation with unit programming . Instruction  Given on  Medication , verbalize understanding. R: Voice no other concerns. Staff continue to monitor

## 2016-06-28 NOTE — Progress Notes (Signed)
Recreation Therapy Notes  Date: 05.18.18 Time: 1:00 pm Location: Craft Room  Group Topic: Social Skills  Goal Area(s) Addresses:  Patient will effectively work with peers towards shared goal. Patient will identify skill used to make activity successful. Patient will identify how skills used during activity can be used to reach post d/c goals.  Behavioral Response: Did not attend   Intervention: Life Boat  Activity: Patients were given a scenario that we were in [place] and we decided to take a boat and explore. Patients were given a list of 16 people (Jimmy Fallon, chef, ex-marine, etc) that were on the boat with us. The boat sprung a leak and is going down. Patients were told there are 2 boats. One is nice, faster, and bigger and the other is a raft. Patients were instructed to pick 8 people from the list to be in the boat with them and 8 people to go in the raft.  Education: LRT educated patients on healthy support systems.  Education Outcome: Patient did not attend group.  Clinical Observations/Feedback: Patient did not attend group.  Tonga Prout M, LRT/CTRS 06/28/2016 1:52 PM 

## 2016-06-28 NOTE — Plan of Care (Signed)
Problem: Safety: Goal: Periods of time without injury will increase Outcome: Progressing Pt safe on unit at this time no visual signs of injury to self.

## 2016-06-29 DIAGNOSIS — F603 Borderline personality disorder: Secondary | ICD-10-CM

## 2016-06-29 DIAGNOSIS — F4310 Post-traumatic stress disorder, unspecified: Secondary | ICD-10-CM

## 2016-06-29 MED ORDER — HYDROXYZINE HCL 25 MG PO TABS: 25.0000 mg | ORAL_TABLET | Freq: Three times a day (TID) | ORAL | Status: DC | PRN

## 2016-06-29 NOTE — Progress Notes (Signed)
Denies SI/HI/AVH. Visible in milieu with appropriate interactions among staff and peers. Voices no other concerns at this time. Medication compliant. Safety maintained. Will continue to monitor

## 2016-06-29 NOTE — Progress Notes (Signed)
Sarah Good denies SI, HI, and AVH. She was bright on approach and she feels her medications are working well.  She spent most of the evening in the dayroom .Pt. Encouraged to let nursing staff know of any concerns or needs. No issues to report on shift at this time.

## 2016-06-29 NOTE — Progress Notes (Signed)
Patient reports having panic attack due to peer having an outburst during therapy. Pt in room beside bed stating that's a trigger for her PTSD. Pt able to talk to staff and doctor during her attack. No difficulty breathing. Educated patient on using coping skills. Offered encouragement and support. Medications given as prescribed. Pt receptive and remains safe on unit with q 15 min checks.

## 2016-06-29 NOTE — [facility] Group Notes (Signed)
[facility] LCSW Group Therapy  06/29/2016 2:39 PM  Type of Therapy:  Group Therapy  Participation Level:  Minimal  Participation Quality:  Attentive  Affect:  Anxious  Cognitive:  Alert  Insight:  None  Engagement in Therapy:  Limited  Modes of Intervention:  Activity, Clarification, Discussion, Education, Exploration, Limit-setting, Problem-solving, Reality Testing, Socialization and Support  Summary of Progress/Problems: Coping Skills: Patients defined and discussed healthy coping skills. Patients identified healthy coping skills they would like to try during hospitalization and after discharge. CSW offered insight to varying coping skills that may have been new to patients such as practicing mindfulness. Patient left group shortly after group began stating she was triggered by another patient. Patient began shaking and crying in group. CSW called patient's nurse who came and spoke with patient to offer support. Patient did not return to group.   Sarah Good G. Garnette CzechSampson MSW, LCSWA 06/29/2016, 2:55 PM

## 2016-06-29 NOTE — Plan of Care (Signed)
Problem: Coping: Goal: Ability to cope will improve Outcome: Not Progressing Pt unable to use coping skills during peer outburst in group.
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
## 2016-06-30 MED ORDER — VITAMIN B-12 1000 MCG PO TABS
1000.0000 ug | ORAL_TABLET | Freq: Every day | ORAL | Status: DC
  Administered 2016-06-30 – 2016-07-01 (×2): 1000 ug via ORAL
  Filled 2016-06-30: qty 1

## 2016-06-30 MED ORDER — CYANOCOBALAMIN 1000 MCG/ML IJ SOLN
1000.0000 ug | Freq: Once | INTRAMUSCULAR | Status: AC
  Administered 2016-06-30: 1000 ug via INTRAMUSCULAR
  Filled 2016-06-30: qty 1
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
## 2016-06-30 NOTE — Progress Notes (Signed)
Patient pleasant and cooperative. No complaints of panic attack thus far. Med  and group compliant. Appropriate with staff and peers. Denies SI, HI, AVH.  Encouragement and support offered. Safety checks maintained. Pt receptive and remains safe on unit with q 15 min checks.

## 2016-06-30 NOTE — [facility] Group Notes (Signed)
[facility] Group Notes:  (Nursing/MHT/Case Management/Adjunct)  Date:  06/30/2016  Time:  5:48 AM  Type of Therapy:  Psychoeducational Skills  Participation Level:  Active  Participation Quality:  Appropriate  Affect:  Appropriate  Cognitive:  Appropriate  Insight:  Appropriate and Good  Engagement in Group:  Engaged  Modes of Intervention:  Discussion, Socialization and Support  Summary of Progress/Problems:  Chancy MilroyLaquanda Y Adan Beal 06/30/2016, 5:48 AM

## 2016-06-30 NOTE — [facility] Group Notes (Signed)
[facility] LCSW Group Therapy  06/30/2016 3:26 PM  Type of Therapy:  Group Therapy  Participation Level:  Active  Participation Quality:  Appropriate and Sharing  Affect:  Appropriate  Cognitive:  Alert  Insight:  Developing/Improving  Engagement in Therapy:  Developing/Improving  Modes of Intervention:  Clarification, Discussion, Education, Limit-setting, Problem-solving, Reality Testing, Socialization and Support  Summary of Progress/Problems: Stages of Grief: Group facilitator defined grief and the stages of the grieving process. Facilitator offered support to patients and allowed patients to share openly about their experiences with grief. Patients explored how their individual experiences with grief have impacted their wellness and recovery. Each patient was challenged to recognize their obstacles in finding acceptance after experiencing a loss. Facilitator processed with patients new coping skills to overcome their challenges with grief. Group members received and gave support to group members.   Sarah Good G. Garnette CzechSampson MSW, LCSWA 06/30/2016, 3:28 PM

## 2016-06-30 NOTE — Progress Notes (Signed)
D: Pt denies SI/HI/AVH. Pt is pleasant and cooperative. Pt stated she was doing a lot better and was appreciative of her stay here.   A: Pt was offered support and encouragement. Pt was given scheduled medications. Pt was encourage to attend groups. Q 15 minute checks were done for safety.   R:Pt attends groups and interacts well with peers and staff. Pt is taking medication. Pt has no complaints at this time .Pt receptive to treatment and safety maintained on unit.

## 2016-07-01 ENCOUNTER — Other Ambulatory Visit (HOSPITAL_COMMUNITY): Payer: 59 | Attending: Psychiatry

## 2016-07-01 NOTE — [facility] Group Notes (Signed)
[facility] Group Notes:  (Nursing/MHT/Case Management/Adjunct)  Date:  07/01/2016  Time:  12:57 AM  Type of Therapy:  Group Therapy  Participation Level:  Active  Participation Quality:  Attentive  Affect:  Appropriate and Irritable  Cognitive:  Appropriate  Insight:  Appropriate  Engagement in Group:  Engaged  Modes of Intervention:  Discussion  Summary of Progress/Problems: Pt stated that her goal was to find a ride home since she due to be discharged. Pt was irritated because she had not been able to get in contact with her roommates to see if one could pick her up. She seemed to get a little overwhelmed with fellow patients interrupting and offering help. Pt agreed to talk to her social worker to ensure she has a way home in the morning.   Fanny Skatesshley Imani Jondavid Schreier 07/01/2016, 12:57 AM
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
## 2016-07-01 NOTE — Progress Notes (Signed)
  [facility] :  Will you be returning to the same living situation after discharge:  Yes,  with roomates At discharge, do you have transportation home?: No. Bus pass provided. Do you have the ability to pay for your medications: Yes,  [facility]  Release of information consent forms completed and in the chart;  Patient's signature needed at discharge.  Patient to Follow up at: Follow-up Information    BEHAVIORAL HEALTH OUTPATIENT THERAPY [place] Follow up on 07/01/2016.   Specialty:  Behavioral Health Why:  Comprehensive Clinical Assessment for Partial Hospitalization Program on 5/21 at 2:30.  Please arrive at 1:30 to complete necessary paperwork.  Please call to cancel/reschedule if needed.   Contact information: [address] [id number] mc [place] [phone] 100 [address]105       Clinic, [facility]. Go on 07/02/2016.   Why:  Please attend your intake appointment with Mel[facility]an [facility] on Tuesday, 07/02/16 at 10am.  Please bring a copy of your hospital discharge paperwork. Contact information: [address] [place] [id number] [phone]           Next level of care provider has [facility] Link:yes  Safety Planning and Suicide Prevention discussed: Yes,  with father  Have you used any form of tobacco in the last 30 days? (Cigarettes, Smokeless Tobacco, Cigars, and/or Pipes): No  Has patient been referred to the Quitline?: N/A patient is not a smoker  Patient has been referred for addiction treatment: Yes  Lorri FrederickWierda, Darlina Mccaughey Jon, LCSW 07/01/2016, 10:27 AM

## 2016-07-01 NOTE — BHH Group Notes (Signed)
[facility] LCSW Group Therapy   07/01/2016 9:30 am  Type of Therapy: Group Therapy   Participation Level: Active   Participation Quality: Attentive, Sharing and Supportive   Affect: Appropriate   Cognitive: Alert and Oriented   Insight: Developing/Improving and Engaged   Engagement in Therapy: Developing/Improving and Engaged   Modes of Intervention: Clarification, Confrontation, Discussion, Education, Exploration,  Limit-setting, Orientation, Problem-solving, Rapport Building, Dance movement psychotherapisteality Testing, Socialization and Support   Summary of Progress/Problems: Pt identified obstacles faced currently and processed barriers involved in overcoming these obstacles. Pt identified steps necessary for overcoming these obstacles and explored motivation (internal and external) for facing these difficulties head on. Pt further identified one area of concern in their lives and chose a goal to focus on for today. Patient identified finances as her biggest obstacle. She stated that in order to overcome her obstacles, she will have to find resources to help pay for medications.  Hampton AbbotKadijah Haiven Good, MSW, LCSWA 07/01/2016, 10:17 AM

## 2016-07-01 NOTE — [facility] Suicide Risk Assessment (Signed)
[facility]   Principal Problem: Major depressive disorder, recurrent severe without psychotic features [facility](HCC) Discharge Diagnoses:  Patient Active Problem List   Diagnosis Date Noted  . PTSD (post-traumatic stress disorder) [F43.10] 06/22/2016  . Borderline personality disorder [F60.3] 06/22/2016  . Vitamin D deficiency [E55.9] 06/22/2016  . Seasonal allergies [J30.2] 06/22/2016  . Major depressive disorder, recurrent severe without psychotic features (HCC) [F33.2] 11/09/2015      Psychiatric Specialty Exam: ROS  Blood pressure 111/72, pulse (!) 106, temperature 98.6 F (37 C), temperature source Oral, resp. rate 19, weight 79.4 kg (175 lb), SpO2 100 %.Body mass index is 30.04 kg/m.                                                       Mental Status Per Nursing Assessment::   On Admission:     Demographic Factors:  Adolescent or young adult, Caucasian and Unemployed  Loss Factors: Financial problems/change in socioeconomic status  Historical Factors: Prior suicide attempts and Impulsivity  Risk Reduction Factors:   Sense of responsibility to family and Positive social support  No access to guns  Continued Clinical Symptoms:  Personality Disorders:   Cluster B Comorbid depression More than one psychiatric diagnosis Previous Psychiatric Diagnoses and Treatments  Cognitive Features That Contribute To Risk:  Polarized thinking    Suicide Risk:  Minimal: No identifiable suicidal ideation.  Patients presenting with no risk factors but with morbid ruminations; may be classified as minimal risk based on the severity of the depressive symptoms  Follow-up Information    BEHAVIORAL HEALTH OUTPATIENT THERAPY [place] Follow up on 07/01/2016.   Specialty:  Behavioral Health Why:  Comprehensive Clinical Assessment for Partial Hospitalization Program on 5/21 at 2:30.  Please arrive at 1:30 to complete necessary paperwork.   Please call to cancel/reschedule if needed.   Contact information: [address] [id number] mc [place] [phone]           Jimmy FootmanHernandez-Gonzalez,  Deaisa Merida, MD 07/01/2016, 9:07 AM

## 2016-07-01 NOTE — Progress Notes (Signed)
Patient denies SI/HI, denies A/V hallucinations. Patient verbalizes understanding of discharge instructions, follow up care and prescriptions. Patient given all belongings from  locker. Patient escorted out by staff, transported by cab to the bus stop.

## 2016-07-01 NOTE — Progress Notes (Signed)
Recreation Therapy Notes  INPATIENT RECREATION TR PLAN  Patient Details Name: Sarah Good MRN: [id number] DOB: [birth year] Today's Date: 07/01/2016  Rec Therapy Plan Is patient appropriate for Therapeutic Recreation?: Yes Treatment times per week: At least once a week TR Treatment/Interventions: 1:1 session, Group participation (Comment) (Appropriate participation in daily recreational therapy tx)  Discharge Criteria Pt will be discharged from therapy if:: Treatment goals are met, Discharged Treatment plan/goals/alternatives discussed and agreed upon by:: Patient/family  Discharge Summary Short term goals set: See Care Plan Short term goals met: Complete Progress toward goals comments: One-to-one attended Which groups?: Self-esteem, Leisure education, Other (Comment) (Self-expression) One-to-one attended: Self-esteem, stress management Reason goals not met: N/A Therapeutic equipment acquired: None Reason patient discharged from therapy: Discharge from hospital Pt/family agrees with progress & goals achieved: Yes Date patient discharged from therapy: 07/01/16   Leonette Monarch, LRT/CTRS 07/01/2016, 11:59 AM

## 2016-07-01 NOTE — Plan of Care (Signed)
Problem: Safety: Goal: Ability to remain free from injury will improve Outcome: Progressing Pt safe on the unit at this time   

## 2018-02-28 IMAGING — DX DG CHEST 2V
2 series · 2 of 2 positions shown · non-contrast
Comparison: None.

CLINICAL DATA: Chest pain

EXAM:
CHEST  2 VIEW

[chest lat]
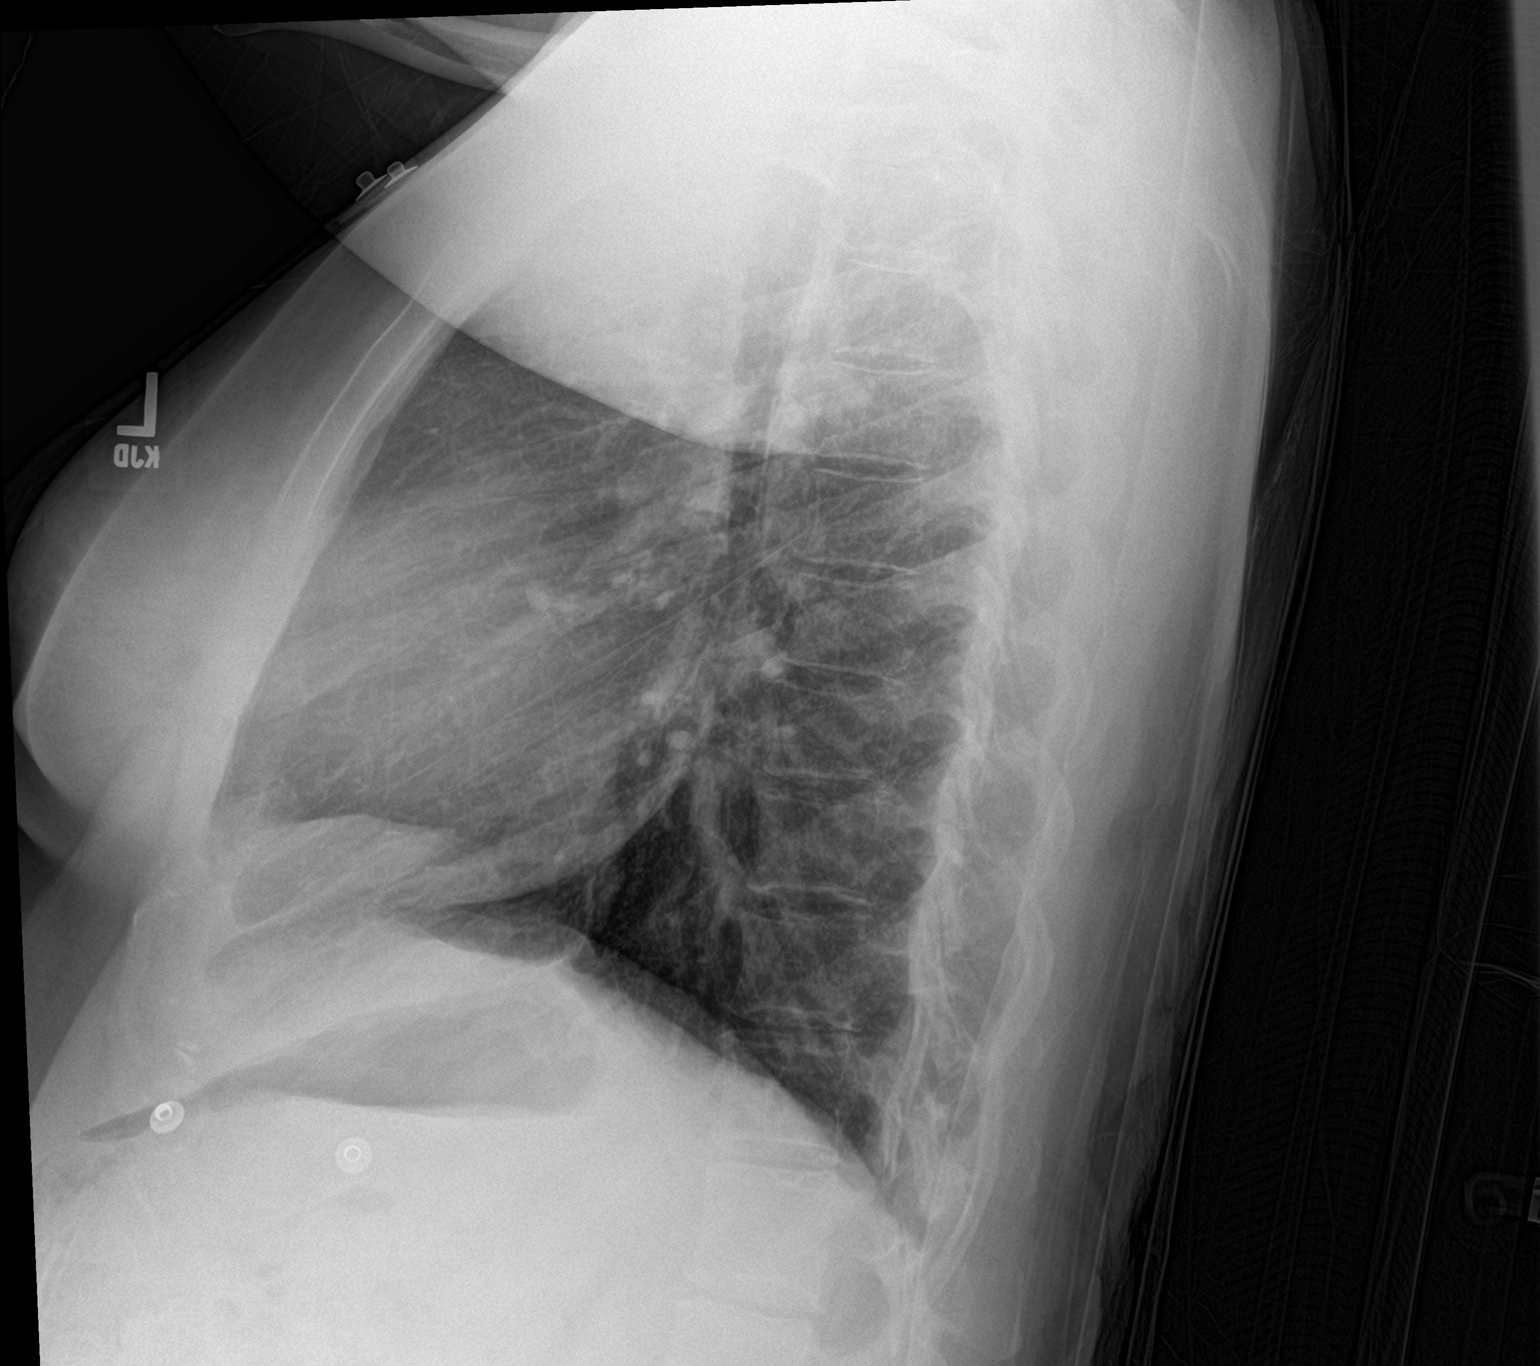

[chest ap]
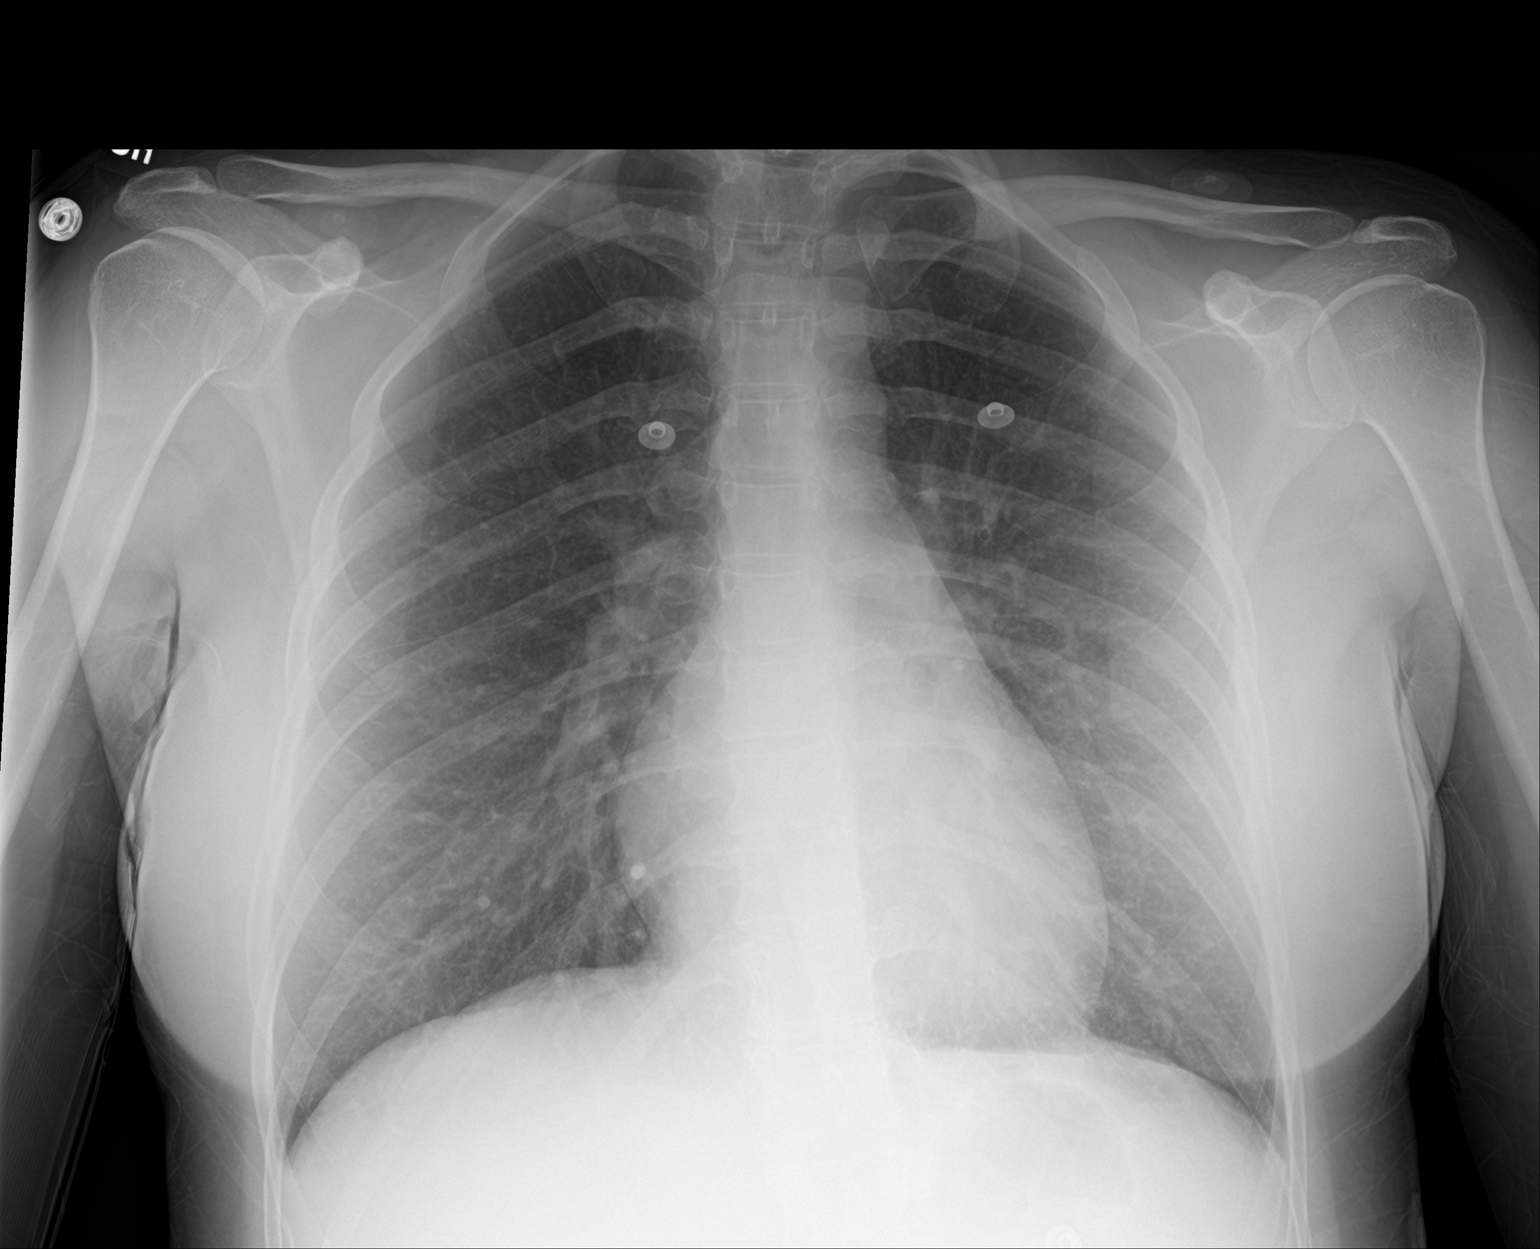

[2 of 2 positions shown; findings below may reference images not displayed]

FINDINGS: The heart size and mediastinal contours are within normal limits.
Both lungs are clear. The visualized skeletal structures are
unremarkable.
IMPRESSION: No active cardiopulmonary disease.
